# Patient Record
Sex: Female | Born: 1945 | Race: White | Hispanic: No | Marital: Married | Smoking: Never smoker
Health system: Southern US, Community
[De-identification: ages and names within clinical notes are randomized; demographics above are authoritative.]

## PROBLEM LIST (undated history)

## (undated) DIAGNOSIS — K219 Gastro-esophageal reflux disease without esophagitis: Secondary | ICD-10-CM

## (undated) DIAGNOSIS — I82409 Acute embolism and thrombosis of unspecified deep veins of unspecified lower extremity: Secondary | ICD-10-CM

## (undated) DIAGNOSIS — G4730 Sleep apnea, unspecified: Secondary | ICD-10-CM

## (undated) DIAGNOSIS — Z9889 Other specified postprocedural states: Secondary | ICD-10-CM

## (undated) DIAGNOSIS — E039 Hypothyroidism, unspecified: Secondary | ICD-10-CM

## (undated) DIAGNOSIS — R112 Nausea with vomiting, unspecified: Secondary | ICD-10-CM

## (undated) DIAGNOSIS — R011 Cardiac murmur, unspecified: Secondary | ICD-10-CM

## (undated) HISTORY — PX: TUBAL LIGATION: SHX77

## (undated) HISTORY — PX: THYROID SURGERY: SHX805

## (undated) HISTORY — PX: APPENDECTOMY: SHX54

## (undated) HISTORY — PX: OOPHORECTOMY: SHX86

## (undated) HISTORY — PX: BACK SURGERY: SHX140

---

## 2006-01-06 ENCOUNTER — Ambulatory Visit: Payer: Self-pay | Admitting: Family Medicine

## 2007-05-13 ENCOUNTER — Ambulatory Visit: Payer: Self-pay | Admitting: Pain Medicine

## 2007-05-19 ENCOUNTER — Ambulatory Visit: Payer: Self-pay | Admitting: Pain Medicine

## 2007-08-04 ENCOUNTER — Ambulatory Visit: Payer: Self-pay | Admitting: Pain Medicine

## 2007-08-24 ENCOUNTER — Ambulatory Visit: Payer: Self-pay | Admitting: Physician Assistant

## 2007-09-24 ENCOUNTER — Ambulatory Visit: Payer: Self-pay | Admitting: Physician Assistant

## 2007-10-01 ENCOUNTER — Ambulatory Visit: Payer: Self-pay | Admitting: Pain Medicine

## 2007-10-08 ENCOUNTER — Encounter: Payer: Self-pay | Admitting: Physician Assistant

## 2007-10-10 ENCOUNTER — Encounter: Payer: Self-pay | Admitting: Physician Assistant

## 2007-11-09 ENCOUNTER — Encounter: Payer: Self-pay | Admitting: Physician Assistant

## 2007-12-08 ENCOUNTER — Ambulatory Visit: Payer: Self-pay | Admitting: Physician Assistant

## 2007-12-10 ENCOUNTER — Encounter: Payer: Self-pay | Admitting: Physician Assistant

## 2007-12-22 ENCOUNTER — Ambulatory Visit: Payer: Self-pay | Admitting: Pain Medicine

## 2008-01-06 ENCOUNTER — Ambulatory Visit: Payer: Self-pay | Admitting: Physician Assistant

## 2008-01-19 ENCOUNTER — Encounter: Payer: Self-pay | Admitting: Physician Assistant

## 2008-01-25 ENCOUNTER — Ambulatory Visit: Payer: Self-pay | Admitting: Internal Medicine

## 2008-01-28 ENCOUNTER — Ambulatory Visit: Payer: Self-pay | Admitting: Pain Medicine

## 2008-02-07 ENCOUNTER — Encounter: Payer: Self-pay | Admitting: Physician Assistant

## 2008-02-15 ENCOUNTER — Ambulatory Visit: Payer: Self-pay | Admitting: Physician Assistant

## 2008-02-23 ENCOUNTER — Ambulatory Visit: Payer: Self-pay | Admitting: Pain Medicine

## 2008-03-01 ENCOUNTER — Encounter: Payer: Self-pay | Admitting: Pain Medicine

## 2008-03-09 ENCOUNTER — Encounter: Payer: Self-pay | Admitting: Pain Medicine

## 2008-03-10 ENCOUNTER — Ambulatory Visit: Payer: Self-pay | Admitting: Physician Assistant

## 2008-03-18 ENCOUNTER — Ambulatory Visit: Payer: Self-pay | Admitting: Family Medicine

## 2008-03-23 ENCOUNTER — Ambulatory Visit: Payer: Self-pay | Admitting: Physician Assistant

## 2008-04-08 ENCOUNTER — Encounter: Payer: Self-pay | Admitting: Pain Medicine

## 2008-04-20 ENCOUNTER — Ambulatory Visit: Payer: Self-pay | Admitting: Physician Assistant

## 2008-05-18 ENCOUNTER — Ambulatory Visit: Payer: Self-pay | Admitting: Physician Assistant

## 2008-06-19 ENCOUNTER — Ambulatory Visit: Payer: Self-pay | Admitting: Pain Medicine

## 2008-06-21 ENCOUNTER — Ambulatory Visit: Payer: Self-pay | Admitting: Physician Assistant

## 2008-07-21 ENCOUNTER — Ambulatory Visit: Payer: Self-pay | Admitting: Physician Assistant

## 2008-08-11 ENCOUNTER — Ambulatory Visit: Payer: Self-pay | Admitting: Pain Medicine

## 2008-08-18 ENCOUNTER — Ambulatory Visit: Payer: Self-pay | Admitting: Pain Medicine

## 2008-09-04 ENCOUNTER — Ambulatory Visit: Payer: Self-pay | Admitting: Family Medicine

## 2008-09-07 ENCOUNTER — Ambulatory Visit: Payer: Self-pay | Admitting: Pain Medicine

## 2008-10-18 ENCOUNTER — Ambulatory Visit: Payer: Self-pay | Admitting: Physician Assistant

## 2009-04-18 ENCOUNTER — Ambulatory Visit: Payer: Self-pay | Admitting: Physician Assistant

## 2009-06-22 ENCOUNTER — Emergency Department: Payer: Self-pay | Admitting: Unknown Physician Specialty

## 2009-06-28 ENCOUNTER — Ambulatory Visit: Payer: Self-pay | Admitting: Pain Medicine

## 2009-07-11 ENCOUNTER — Ambulatory Visit: Payer: Self-pay | Admitting: Pain Medicine

## 2009-07-25 ENCOUNTER — Ambulatory Visit: Payer: Self-pay | Admitting: Physician Assistant

## 2009-08-22 ENCOUNTER — Ambulatory Visit: Payer: Self-pay | Admitting: Pain Medicine

## 2009-09-05 ENCOUNTER — Ambulatory Visit: Payer: Self-pay | Admitting: Pain Medicine

## 2009-10-04 ENCOUNTER — Ambulatory Visit: Payer: Self-pay | Admitting: Physician Assistant

## 2009-10-17 ENCOUNTER — Ambulatory Visit: Payer: Self-pay | Admitting: Pain Medicine

## 2009-11-07 ENCOUNTER — Ambulatory Visit: Payer: Self-pay | Admitting: Physician Assistant

## 2009-11-10 ENCOUNTER — Ambulatory Visit: Payer: Self-pay | Admitting: Unknown Physician Specialty

## 2010-06-04 ENCOUNTER — Ambulatory Visit: Payer: Self-pay | Admitting: Family Medicine

## 2010-07-16 ENCOUNTER — Ambulatory Visit: Payer: Self-pay | Admitting: Pain Medicine

## 2011-03-02 ENCOUNTER — Ambulatory Visit: Payer: Self-pay | Admitting: Internal Medicine

## 2011-03-14 ENCOUNTER — Ambulatory Visit: Payer: Self-pay | Admitting: Pain Medicine

## 2011-03-27 ENCOUNTER — Ambulatory Visit: Payer: Self-pay | Admitting: Pain Medicine

## 2011-04-16 ENCOUNTER — Ambulatory Visit: Payer: Self-pay | Admitting: Pain Medicine

## 2011-05-09 ENCOUNTER — Ambulatory Visit: Payer: Self-pay | Admitting: Pain Medicine

## 2011-05-23 ENCOUNTER — Ambulatory Visit: Payer: Self-pay | Admitting: Pain Medicine

## 2011-06-26 ENCOUNTER — Ambulatory Visit: Payer: Self-pay | Admitting: Pain Medicine

## 2011-07-25 ENCOUNTER — Ambulatory Visit: Payer: Self-pay | Admitting: Pain Medicine

## 2011-07-26 ENCOUNTER — Encounter: Payer: Self-pay | Admitting: Pain Medicine

## 2011-11-11 ENCOUNTER — Ambulatory Visit: Payer: Self-pay | Admitting: Pain Medicine

## 2012-01-20 ENCOUNTER — Ambulatory Visit: Payer: Self-pay | Admitting: Neurological Surgery

## 2012-01-21 ENCOUNTER — Ambulatory Visit: Payer: Self-pay | Admitting: Orthopedic Surgery

## 2012-02-04 ENCOUNTER — Encounter (HOSPITAL_COMMUNITY): Payer: Self-pay | Admitting: Pharmacy Technician

## 2012-02-10 ENCOUNTER — Emergency Department: Payer: Self-pay

## 2012-02-10 ENCOUNTER — Inpatient Hospital Stay (HOSPITAL_COMMUNITY): Admission: RE | Admit: 2012-02-10 | Discharge: 2012-02-10 | Payer: Medicare Other | Source: Ambulatory Visit

## 2012-02-10 ENCOUNTER — Ambulatory Visit: Payer: Self-pay | Admitting: Family Medicine

## 2012-02-10 LAB — CBC
HCT: 38.2 % (ref 35.0–47.0)
HGB: 12.8 g/dL (ref 12.0–16.0)
MCH: 31.7 pg (ref 26.0–34.0)
MCHC: 33.4 g/dL (ref 32.0–36.0)

## 2012-02-10 LAB — PROTIME-INR
INR: 1
Prothrombin Time: 13.8 secs (ref 11.5–14.7)

## 2012-02-10 LAB — BASIC METABOLIC PANEL
Calcium, Total: 9.2 mg/dL (ref 8.5–10.1)
Co2: 24 mmol/L (ref 21–32)
EGFR (African American): >60
EGFR (Non-African Amer.): >60
Glucose: 147 mg/dL — ABNORMAL HIGH (ref 65–99)
Osmolality: 287 (ref 275–301)
Sodium: 142 mmol/L (ref 136–145)

## 2012-02-10 NOTE — Pre-Procedure Instructions (Signed)
19 Sheila Singleton  02/10/2012   Your procedure is scheduled on:  Wednesday February 19, 2012 at 1034 am  Report to [facility] at 0830 AM.  Call this number if you have problems the morning of surgery: [phone]   Remember:   Do not eat food:After Midnight.  May have clear liquids: up to 4 Hours before arrival until 0830 am.  Clear liquids include soda, tea, black coffee, apple or grape juice, broth.  Take these medicines the morning of surgery with A SIP OF WATER: Cymbalta, Levothryoxine,and  Prilosec.   Do not wear jewelry, make-up or nail polish.  Do not wear lotions, powders, or perfumes. You may wear deodorant.  Do not shave 48 hours prior to surgery.  Do not bring valuables to the hospital.  Contacts, dentures or bridgework may not be worn into surgery.  Leave suitcase in the car. After surgery it may be brought to your room.  For patients admitted to the hospital, checkout time is 11:00 AM the day of discharge.   Patients discharged the day of surgery will not be allowed to drive home.  Name and phone number of your driver:   Special Instructions: CHG Shower Use Special Wash: 1/2 bottle night before surgery and 1/2 bottle morning of surgery.   Please read over the following fact sheets that you were given: Pain Booklet, Coughing and Deep Breathing, Blood Transfusion Information and Surgical Site Infection Prevention

## 2012-02-11 ENCOUNTER — Inpatient Hospital Stay (HOSPITAL_COMMUNITY): Admission: RE | Admit: 2012-02-11 | Payer: Medicare Other | Source: Ambulatory Visit

## 2012-02-18 MED ORDER — VANCOMYCIN HCL 500 MG IV SOLR
500.0000 mg | Freq: Once | INTRAVENOUS | Status: DC
  Filled 2012-02-18: qty 500

## 2012-02-18 NOTE — Progress Notes (Signed)
Left message with answering service for Dr.Jones. Pt states surgery cancelled because she is being treated for a DVT.

## 2012-02-19 ENCOUNTER — Inpatient Hospital Stay (HOSPITAL_COMMUNITY): Admission: RE | Admit: 2012-02-19 | Payer: Medicare Other | Source: Ambulatory Visit | Admitting: Neurological Surgery

## 2012-02-19 ENCOUNTER — Encounter (HOSPITAL_COMMUNITY): Admission: RE | Payer: Self-pay | Source: Ambulatory Visit

## 2012-02-19 SURGERY — POSTERIOR LUMBAR FUSION 1 LEVEL
Anesthesia: General | Site: Back

## 2012-03-17 ENCOUNTER — Ambulatory Visit: Payer: Self-pay | Admitting: Family Medicine

## 2012-03-19 ENCOUNTER — Encounter (HOSPITAL_COMMUNITY): Payer: Self-pay | Admitting: Pharmacy Technician

## 2012-03-23 ENCOUNTER — Other Ambulatory Visit (HOSPITAL_COMMUNITY): Payer: Self-pay | Admitting: Neurological Surgery

## 2012-03-23 DIAGNOSIS — Z86718 Personal history of other venous thrombosis and embolism: Secondary | ICD-10-CM

## 2012-03-24 ENCOUNTER — Other Ambulatory Visit: Payer: Self-pay | Admitting: Radiology

## 2012-03-24 ENCOUNTER — Telehealth (HOSPITAL_COMMUNITY): Payer: Self-pay

## 2012-03-24 NOTE — Telephone Encounter (Signed)
Spoke w/ Sheila Singleton about her IVC procedure date and time.  She will be here

## 2012-03-26 ENCOUNTER — Encounter (HOSPITAL_COMMUNITY): Payer: Self-pay

## 2012-03-26 ENCOUNTER — Encounter (HOSPITAL_COMMUNITY)
Admission: RE | Admit: 2012-03-26 | Discharge: 2012-03-26 | Disposition: A | Discharge status: Home / Self Care | Payer: Medicare Other | Source: Ambulatory Visit | Attending: Neurological Surgery | Admitting: Neurological Surgery

## 2012-03-26 HISTORY — DX: Nausea with vomiting, unspecified: R11.2

## 2012-03-26 HISTORY — DX: Other specified postprocedural states: Z98.890

## 2012-03-26 HISTORY — DX: Hypothyroidism, unspecified: E03.9

## 2012-03-26 HISTORY — DX: Gastro-esophageal reflux disease without esophagitis: K21.9

## 2012-03-26 LAB — BASIC METABOLIC PANEL
CO2: 24 mEq/L (ref 19–32)
Chloride: 103 mEq/L (ref 96–112)
Creatinine, Ser: 0.81 mg/dL (ref 0.50–1.10)

## 2012-03-26 LAB — TYPE AND SCREEN: Antibody Screen: NEGATIVE

## 2012-03-26 LAB — SURGICAL PCR SCREEN
MRSA, PCR: POSITIVE — AB
Staphylococcus aureus: POSITIVE — AB

## 2012-03-26 LAB — CBC
HCT: 38.2 % (ref 36.0–46.0)
MCV: 92.5 fL (ref 78.0–100.0)
RBC: 4.13 MIL/uL (ref 3.87–5.11)
RDW: 14.4 % (ref 11.5–15.5)
WBC: 8.8 10*3/uL (ref 4.0–10.5)

## 2012-03-26 LAB — DIFFERENTIAL
Basophils Relative: 0 % (ref 0–1)
Eosinophils Relative: 3 % (ref 0–5)
Lymphs Abs: 1.6 10*3/uL (ref 0.7–4.0)
Monocytes Relative: 5 % (ref 3–12)
Neutro Abs: 6.5 10*3/uL (ref 1.7–7.7)

## 2012-03-26 LAB — APTT: aPTT: 31 seconds (ref 24–37)

## 2012-03-26 NOTE — Pre-Procedure Instructions (Signed)
20 GILDA ABBOUD  03/26/2012   Your procedure is scheduled on:  Thursday, April 25  Report to [facility] at 5:30AM.  Call this number if you have problems the morning of surgery: [phone]   Remember:   Do not eat food:After Midnight.  May have clear liquids: up to 4 Hours before arrival.  (1:30AM)  Clear liquids include soda, tea, black coffee, apple or grape juice, broth.  Take these medicines the morning of surgery with A SIP OF WATER: TYLENOL, SYNTHROID,PRILOSEC                STOP COUMADIN AS DIRECTED   Do not wear jewelry, make-up or nail polish.  Do not wear lotions, powders, or perfumes. You may wear deodorant.  Do not shave 48 hours prior to surgery.  Do not bring valuables to the hospital.  Contacts, dentures or bridgework may not be worn into surgery.  Leave suitcase in the car. After surgery it may be brought to your room.  For patients admitted to the hospital, checkout time is 11:00 AM the day of discharge.   Patients discharged the day of surgery will not be allowed to drive home.  Name and phone number of your driver: NA  Special Instructions: CHG Shower Use Special Wash: 1/2 bottle night before surgery and 1/2 bottle morning of surgery.   Please read over the following fact sheets that you were given: Pain Booklet, Coughing and Deep Breathing, Blood Transfusion Information, MRSA Information and Surgical Site Infection Prevention

## 2012-03-27 ENCOUNTER — Ambulatory Visit (HOSPITAL_COMMUNITY)
Admission: RE | Admit: 2012-03-27 | Discharge: 2012-03-27 | Disposition: A | Discharge status: Home / Self Care | Payer: Medicare Other | Source: Ambulatory Visit | Attending: Neurological Surgery | Admitting: Neurological Surgery

## 2012-03-27 ENCOUNTER — Encounter (HOSPITAL_COMMUNITY): Payer: Self-pay

## 2012-03-27 DIAGNOSIS — Z01812 Encounter for preprocedural laboratory examination: Secondary | ICD-10-CM | POA: Insufficient documentation

## 2012-03-27 DIAGNOSIS — Z86718 Personal history of other venous thrombosis and embolism: Secondary | ICD-10-CM

## 2012-03-27 DIAGNOSIS — F430 Acute stress reaction: Secondary | ICD-10-CM | POA: Insufficient documentation

## 2012-03-27 DIAGNOSIS — E119 Type 2 diabetes mellitus without complications: Secondary | ICD-10-CM | POA: Insufficient documentation

## 2012-03-27 DIAGNOSIS — Z7901 Long term (current) use of anticoagulants: Secondary | ICD-10-CM | POA: Insufficient documentation

## 2012-03-27 DIAGNOSIS — E039 Hypothyroidism, unspecified: Secondary | ICD-10-CM | POA: Insufficient documentation

## 2012-03-27 DIAGNOSIS — I82409 Acute embolism and thrombosis of unspecified deep veins of unspecified lower extremity: Secondary | ICD-10-CM | POA: Insufficient documentation

## 2012-03-27 HISTORY — DX: Acute embolism and thrombosis of unspecified deep veins of unspecified lower extremity: I82.409

## 2012-03-27 LAB — GLUCOSE, CAPILLARY: Glucose-Capillary: 169 mg/dL — ABNORMAL HIGH (ref 70–99)

## 2012-03-27 MED ORDER — FENTANYL CITRATE 0.05 MG/ML IJ SOLN
INTRAMUSCULAR | Status: AC | PRN
  Administered 2012-03-27: 50 ug via INTRAVENOUS

## 2012-03-27 MED ORDER — IOHEXOL 300 MG/ML  SOLN
100.0000 mL | Freq: Once | INTRAMUSCULAR | Status: AC | PRN
  Administered 2012-03-27: 45 mL via INTRAVENOUS

## 2012-03-27 MED ORDER — SODIUM CHLORIDE 0.9 % IV SOLN
Freq: Once | INTRAVENOUS | Status: AC
  Administered 2012-03-27: 09:00:00 via INTRAVENOUS

## 2012-03-27 MED ORDER — MIDAZOLAM HCL 5 MG/5ML IJ SOLN
INTRAMUSCULAR | Status: AC | PRN
  Administered 2012-03-27: 1 mg via INTRAVENOUS

## 2012-03-27 MED ORDER — MIDAZOLAM HCL 2 MG/2ML IJ SOLN
INTRAMUSCULAR | Status: AC
  Filled 2012-03-27: qty 4

## 2012-03-27 MED ORDER — FENTANYL CITRATE 0.05 MG/ML IJ SOLN
INTRAMUSCULAR | Status: AC
  Filled 2012-03-27: qty 4

## 2012-03-27 NOTE — H&P (Signed)
Sheila Singleton is an 66 y.o. female.   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 PONV (postoperative nausea and vomiting)     DIFFICULTY AWAKENING  . Diabetes mellitus   . Hypothyroidism   . GERD (gastroesophageal reflux disease)   . DVT (deep venous thrombosis)     post operative x 2    Past Surgical History  Procedure Date  . Appendectomy   . Thyroid surgery     LEFT  . Oophorectomy   . Tubal ligation     No family history on file. Social History:  does not have a smoking history on file. She does not have any smokeless tobacco history on file. She reports that she does not drink alcohol or use illicit drugs.  Allergies:  Allergies  Allergen Reactions  . Tramadol Shortness Of Breath  . Metformin And Related Diarrhea and Nausea Only  . Penicillins Swelling    Medications Prior to Admission  Medication Sig Dispense Refill  . acetaminophen (TYLENOL) 650 MG suppository Place 1,300 mg rectally 2 (two) times daily.      . beta carotene w/minerals (OCUVITE) tablet Take 1 tablet by mouth daily.      . cholecalciferol (VITAMIN D) 1000 UNITS tablet Take 1,000 Units by mouth daily.      . DULoxetine (CYMBALTA) 60 MG capsule Take 60 mg by mouth daily.      Marland Kitchen glipiZIDE (GLUCOTROL) 10 MG tablet Take 20 mg by mouth daily.      Marland Kitchen levothyroxine (SYNTHROID, LEVOTHROID) 175 MCG tablet Take 175 mcg by mouth daily.      . Multiple Vitamins-Minerals (MULTIVITAMIN GUMMIES ADULT) CHEW Chew 2 tablets by mouth daily.      Marland Kitchen omeprazole (PRILOSEC) 20 MG capsule Take 20 mg by mouth daily.      . pioglitazone (ACTOS) 30 MG  tablet Take 30 mg by mouth daily.      Marland Kitchen warfarin (COUMADIN) 3 MG tablet Take 3 mg by mouth See admin instructions. Mon, tues, wednes, thurs, fri      . warfarin (COUMADIN) 4 MG tablet Take 4 mg by mouth See admin instructions. On Saturday and sunday       Medications Prior to Admission  Medication Dose Route Frequency Provider Last Rate Last Dose  . 0.9 %  sodium chloride infusion   Intravenous Once Robet Leu, PA        Results for orders placed during the hospital encounter of 03/26/12 (from the past 48 hour(s))  SURGICAL PCR SCREEN     Status: Abnormal   Collection Time   03/26/12  3:17 PM      Component Value Range Comment   MRSA, PCR POSITIVE (*) NEGATIVE     Staphylococcus aureus POSITIVE (*) NEGATIVE    APTT     Status: Normal   Collection Time   03/26/12  3:30 PM      Component Value Range Comment   aPTT 31  24 - 37 (seconds)   BASIC METABOLIC PANEL     Status: Abnormal   Collection Time   03/26/12  3:30 PM  Component Value Range Comment   Sodium 139  135 - 145 (mEq/L)    Potassium 3.9  3.5 - 5.1 (mEq/L)    Chloride 103  96 - 112 (mEq/L)    CO2 24  19 - 32 (mEq/L)    Glucose, Bld 104 (*) 70 - 99 (mg/dL)    BUN 15  6 - 23 (mg/dL)    Creatinine, Ser 1.61  0.50 - 1.10 (mg/dL)    Calcium 9.4  8.4 - 10.5 (mg/dL)    GFR calc non Af Amer 73 (*) >90 (mL/min)    GFR calc Af Amer 85 (*) >90 (mL/min)   CBC     Status: Normal   Collection Time   03/26/12  3:30 PM      Component Value Range Comment   WBC 8.8  4.0 - 10.5 (K/uL) WHITE COUNT CONFIRMED ON SMEAR   RBC 4.13  3.87 - 5.11 (MIL/uL)    Hemoglobin 13.0  12.0 - 15.0 (g/dL)    HCT 09.6  04.5 - 40.9 (%)    MCV 92.5  78.0 - 100.0 (fL)    MCH 31.5  26.0 - 34.0 (pg)    MCHC 34.0  30.0 - 36.0 (g/dL)    RDW 81.1  91.4 - 78.2 (%)    Platelets 328  150 - 400 (K/uL)   DIFFERENTIAL     Status: Normal   Collection Time   03/26/12  3:30 PM      Component Value Range Comment   Neutrophils Relative 74  43 - 77 (%)     Lymphocytes Relative 18  12 - 46 (%)    Monocytes Relative 5  3 - 12 (%)    Eosinophils Relative 3  0 - 5 (%)    Basophils Relative 0  0 - 1 (%)    Neutro Abs 6.5  1.7 - 7.7 (K/uL)    Lymphs Abs 1.6  0.7 - 4.0 (K/uL)    Monocytes Absolute 0.4  0.1 - 1.0 (K/uL)    Eosinophils Absolute 0.3  0.0 - 0.7 (K/uL)    Basophils Absolute 0.0  0.0 - 0.1 (K/uL)    Smear Review MORPHOLOGY UNREMARKABLE     PROTIME-INR     Status: Normal   Collection Time   03/26/12  3:30 PM      Component Value Range Comment   Prothrombin Time 14.9  11.6 - 15.2 (seconds)    INR 1.15  0.00 - 1.49    TYPE AND SCREEN     Status: Normal   Collection Time   03/26/12  4:00 PM      Component Value Range Comment   ABO/RH(D) O POS      Antibody Screen NEG      Sample Expiration 04/09/2012     ABO/RH     Status: Normal   Collection Time   03/26/12  4:00 PM      Component Value Range Comment   ABO/RH(D) O POS      Dg Chest 2 View  03/26/2012  *RADIOLOGY REPORT*  Clinical Data: Preop for spondylolisthesis lumbar spine  CHEST - 2 VIEW  Comparison: None.  Findings: Cardiomediastinal silhouette is unremarkable.  No acute infiltrate or pleural effusion.  No pulmonary edema.  Mild degenerative changes mid thoracic spine.  Linear atelectasis or scarring noted in the lingula and right base.  IMPRESSION: No acute infiltrate or pulmonary edema.  Linear atelectasis or scarring noted right base and lingula.  Original Report Authenticated By: Lang Snow  POP, M.D.    Review of Systems  Constitutional: Negative for fever.  Respiratory: Negative for cough.   Cardiovascular: Negative for chest pain.  Gastrointestinal: Negative for nausea and vomiting.  Neurological: Negative for headaches.    There were no vitals taken for this visit. Physical Exam  Constitutional: She is oriented to person, place, and time. She appears well-developed and well-nourished.  Cardiovascular: Normal rate, regular rhythm and normal heart sounds.   No murmur  heard. Respiratory: Effort normal and breath sounds normal. She has no wheezes.  GI: Soft. Bowel sounds are normal. There is no tenderness.  Musculoskeletal: Normal range of motion.  Neurological: She is alert and oriented to person, place, and time.  Skin: Skin is warm and dry.  Psychiatric: She has a normal mood and affect. Her behavior is normal. Judgment and thought content normal.     Assessment/Plan Pt has hx of recurrent DVT; scheduled for lumbar surgery 4/25 Off coumadin now for 1 week Scheduled now for retrievable IVC filter Pt aware of procedure benefits and risks and agreeable to proceed. Consent signed.  Naiyana Barbian A 03/27/2012, 8:17 AM

## 2012-03-27 NOTE — Discharge Instructions (Signed)
Inferior Vena Cava Filter Insertion °Insertion of an inferior vena cava (IVC) filter is usually a safe procedure. It is a procedure using a filter designed to help prevent blood clots in the legs or pelvis from traveling to the lungs. A large blood clot in the lungs can cause death. The risks involved are usually small and easily managed. This device is only used when blood thinners (anticoagulants) cannot be used. Some of these reasons may be: °· Severe platelet problems or shortage.  °· Recent or current major bleeding which cannot be treated.  °· Bleeding associated with anticoagulants.  °· Recurrence of blood clot while on anticoagulants.  °· The need for surgery in the near future.  °· Bleeding in the head.  °The filter is a small, metal device about an inch long. It is shaped like the spokes of an umbrella. The filter is placed in the inferior vena cava. The inferior vena cava is the large vein which returns blood from the lower body to the heart.  °Blood clots are sometimes treated with blood thinning drugs called anti-coagulants. Filters are used when blood thinners may not be enough. Your caregivers will discuss these issues with you. Together you can determine the best course of treatment to take. °EXPECTATIONS OF A FILTER °· An IVC filter will reduce the risk, not eliminate it and cannot prevent small PE's.  °· It does not stop deep vein clots from growing, recurring, or developing the postphlebitic syndrome.  °RISKS AND COMPLICATIONS °· Vena cava filter insertion is a very safe procedure. Occasionally a small bruise forms around the needle insertion. A larger accumulation of blood called a hematoma may form. This is usually of no concern.  °· Continued bleeding or infections are uncommon.  °· Rarely damage is done to the vein by the catheter. This may require surgery or repair. There is a possibility that the filter can cause blockage of the vena cava. This can cause some swelling of the legs. There is a  possibility that the filter will eventually fail and not work properly. Despite some problems, the procedure is usually safe and carried out without them  °PROCEDURE  °· The procedure usually takes about one half to one hour but this can vary.  °· A specialist in reading x-rays (radiologist) usually performs this procedure.  °· It is usually performed in a special room in the x-ray department. It is often done in a hospital or same-day surgical center. You will be asked to put on a hospital gown.  °· Let your caregivers know of all allergies. This is very important if you have reacted to a dye given through the vein (intravenous) used for x-rays.  °· Do not eat or drink for four hours before the procedure or as instructed by your caregiver.  °· Sedatives are sometimes given to relieve anxiety.  °· The procedure is often done through the femoral vein (big vein in the groin). The skin around this area is usually shaved.  °· You will lie on the X-ray table, generally flat on your back. You need to have a needle put into a vein in your arm, so that the radiologist can give you medications (sedatives or painkillers).  °· An oxygen monitoring device may be attached to your finger. Oxygen may be given.  °· Everything is kept as sterile as possible during the procedure. The skin near the point of needle insertion will be cleaned with antiseptic and the area draped with sterile towels.  °·   The skin and deeper tissues over the vein will be made numb with a local anesthetic. This is a medication like Novocaine. You are awake during the procedure and can let your caregivers know if you have discomfort.  °· A needle is then put into the vein. A guide wire is placed through the needle and into the vein. This is used to help insert a catheter into your vein. The radiologist uses the X-ray equipment to make sure that the catheter and the wire are in the correct position. The wire is then withdrawn. The filter can then be released  from the catheter and left in place in the vena cava.  °· The catheter is then removed. Pressure will be kept on the needle insertion point for several minutes or until it is unlikely to bleed.  °SEEK IMMEDIATE MEDICAL CARE IF:  °· You develop swelling and discoloration or pain in the legs.  °· Your legs become pale and cold or blue.  °· You develop shortness of breath, feel faint or pass out.  °· You develop chest pain, cough, difficulty breathing or cough up blood.  °· You develop a rash or feel you are having problems which may be a side effect of medications given.  °· You develop weakness, difficulty moving your arms or legs or balance problems.  °· You develop problems with speech or vision.  °Document Released: 01/15/2006 Document Revised: 11/14/2011 Document Reviewed: 01/04/2008 °[employer]® Patient Information ©2012 [employer]. °

## 2012-03-27 NOTE — Procedures (Signed)
Successful placement of an infrarenal IVC filter via the right internal jugular vein..  No immediate complications.   

## 2012-03-27 NOTE — Consult Note (Signed)
Anesthesia Chart Review:  Patient is a 66 year old female scheduled for PLIF L4-5 with pedicle screws on 04/02/12.  History includes post-op N/V, DM2, hypothyroidism, GERD, 2 post-operative DVT.  By notes, she had a doppler last week that showed no existing DVT.  With her upcoming surgery and history, a retrievable IVC filter was recommended by Dr. Yetta Barre and placed today in IR, 03/27/12.     Labs acceptable.  CXR on 03/26/12 showed: No acute infiltrate or pulmonary edema. Linear atelectasis or  scarring noted right base and lingula.  EKG from 03/26/12 showed NSR with sinus arrhythmia.  Plan to proceed if no acute change in her status.

## 2012-04-02 ENCOUNTER — Encounter (HOSPITAL_COMMUNITY): Payer: Self-pay | Admitting: Vascular Surgery

## 2012-04-02 ENCOUNTER — Encounter (HOSPITAL_COMMUNITY): Payer: Self-pay | Admitting: General Practice

## 2012-04-02 ENCOUNTER — Inpatient Hospital Stay (HOSPITAL_COMMUNITY): Payer: Medicare Other | Admitting: Vascular Surgery

## 2012-04-02 ENCOUNTER — Inpatient Hospital Stay (HOSPITAL_COMMUNITY)
Admission: RE | Admit: 2012-04-02 | Discharge: 2012-04-05 | DRG: 460 | Disposition: A | Discharge status: Home / Self Care | Payer: Medicare Other | Source: Ambulatory Visit | Attending: Neurological Surgery | Admitting: Neurological Surgery

## 2012-04-02 ENCOUNTER — Encounter (HOSPITAL_COMMUNITY): Payer: Self-pay | Admitting: *Deleted

## 2012-04-02 ENCOUNTER — Inpatient Hospital Stay (HOSPITAL_COMMUNITY): Payer: Medicare Other

## 2012-04-02 ENCOUNTER — Encounter (HOSPITAL_COMMUNITY)
Admission: RE | Disposition: A | Discharge status: Home / Self Care | Payer: Self-pay | Source: Ambulatory Visit | Attending: Neurological Surgery

## 2012-04-02 DIAGNOSIS — Z6841 Body Mass Index (BMI) 40.0 and over, adult: Secondary | ICD-10-CM

## 2012-04-02 DIAGNOSIS — Z79899 Other long term (current) drug therapy: Secondary | ICD-10-CM

## 2012-04-02 DIAGNOSIS — M47816 Spondylosis without myelopathy or radiculopathy, lumbar region: Secondary | ICD-10-CM

## 2012-04-02 DIAGNOSIS — Z7901 Long term (current) use of anticoagulants: Secondary | ICD-10-CM

## 2012-04-02 DIAGNOSIS — Q762 Congenital spondylolisthesis: Principal | ICD-10-CM

## 2012-04-02 DIAGNOSIS — M48062 Spinal stenosis, lumbar region with neurogenic claudication: Secondary | ICD-10-CM | POA: Diagnosis present

## 2012-04-02 DIAGNOSIS — E119 Type 2 diabetes mellitus without complications: Secondary | ICD-10-CM | POA: Diagnosis present

## 2012-04-02 DIAGNOSIS — Z01818 Encounter for other preprocedural examination: Secondary | ICD-10-CM

## 2012-04-02 DIAGNOSIS — K219 Gastro-esophageal reflux disease without esophagitis: Secondary | ICD-10-CM | POA: Diagnosis present

## 2012-04-02 DIAGNOSIS — E039 Hypothyroidism, unspecified: Secondary | ICD-10-CM | POA: Diagnosis present

## 2012-04-02 DIAGNOSIS — Z86718 Personal history of other venous thrombosis and embolism: Secondary | ICD-10-CM

## 2012-04-02 HISTORY — DX: Cardiac murmur, unspecified: R01.1

## 2012-04-02 HISTORY — DX: Sleep apnea, unspecified: G47.30

## 2012-04-02 HISTORY — PX: LUMBAR FUSION: SHX111

## 2012-04-02 LAB — GLUCOSE, CAPILLARY
Glucose-Capillary: 166 mg/dL — ABNORMAL HIGH (ref 70–99)
Glucose-Capillary: 232 mg/dL — ABNORMAL HIGH (ref 70–99)

## 2012-04-02 SURGERY — POSTERIOR LUMBAR FUSION 1 LEVEL
Anesthesia: General | Site: Back

## 2012-04-02 MED ORDER — MORPHINE SULFATE 2 MG/ML IJ SOLN: 0.0500 mg/kg | INTRAMUSCULAR | Status: DC | PRN

## 2012-04-02 MED ORDER — HETASTARCH-ELECTROLYTES 6 % IV SOLN
INTRAVENOUS | Status: DC | PRN
  Administered 2012-04-02: 09:00:00 via INTRAVENOUS

## 2012-04-02 MED ORDER — DULOXETINE HCL 60 MG PO CPEP
60.0000 mg | ORAL_CAPSULE | Freq: Every day | ORAL | Status: DC
  Administered 2012-04-02 – 2012-04-05 (×4): 60 mg via ORAL
  Filled 2012-04-02 (×4): qty 1

## 2012-04-02 MED ORDER — SCOPOLAMINE 1 MG/3DAYS TD PT72
MEDICATED_PATCH | TRANSDERMAL | Status: DC | PRN
  Administered 2012-04-02: 1 via TRANSDERMAL

## 2012-04-02 MED ORDER — ENOXAPARIN SODIUM 40 MG/0.4ML ~~LOC~~ SOLN
40.0000 mg | SUBCUTANEOUS | Status: DC
  Administered 2012-04-03 – 2012-04-04 (×2): 40 mg via SUBCUTANEOUS
  Filled 2012-04-02 (×3): qty 0.4

## 2012-04-02 MED ORDER — CELECOXIB 200 MG PO CAPS
200.0000 mg | ORAL_CAPSULE | Freq: Two times a day (BID) | ORAL | Status: DC
  Administered 2012-04-02 – 2012-04-05 (×7): 200 mg via ORAL
  Filled 2012-04-02 (×8): qty 1

## 2012-04-02 MED ORDER — PHENOL 1.4 % MT LIQD
1.0000 | OROMUCOSAL | Status: DC | PRN
  Filled 2012-04-02: qty 177

## 2012-04-02 MED ORDER — LIDOCAINE HCL (CARDIAC) 20 MG/ML IV SOLN
INTRAVENOUS | Status: DC | PRN
  Administered 2012-04-02: 50 mg via INTRAVENOUS

## 2012-04-02 MED ORDER — ONDANSETRON HCL 4 MG/2ML IJ SOLN: 4.0000 mg | Freq: Once | INTRAMUSCULAR | Status: DC | PRN

## 2012-04-02 MED ORDER — VECURONIUM BROMIDE 10 MG IV SOLR
INTRAVENOUS | Status: DC | PRN
  Administered 2012-04-02 (×2): 1 mg via INTRAVENOUS

## 2012-04-02 MED ORDER — SODIUM CHLORIDE 0.9 % IJ SOLN
3.0000 mL | Freq: Two times a day (BID) | INTRAMUSCULAR | Status: DC
  Administered 2012-04-03 – 2012-04-04 (×3): 3 mL via INTRAVENOUS

## 2012-04-02 MED ORDER — LEVOTHYROXINE SODIUM 175 MCG PO TABS
175.0000 ug | ORAL_TABLET | Freq: Every day | ORAL | Status: DC
  Administered 2012-04-03 – 2012-04-05 (×3): 175 ug via ORAL
  Filled 2012-04-02 (×3): qty 1

## 2012-04-02 MED ORDER — GLYCOPYRROLATE 0.2 MG/ML IJ SOLN
INTRAMUSCULAR | Status: DC | PRN
  Administered 2012-04-02: 1 mg via INTRAVENOUS

## 2012-04-02 MED ORDER — MENTHOL 3 MG MT LOZG
1.0000 | LOZENGE | OROMUCOSAL | Status: DC | PRN
  Filled 2012-04-02: qty 9

## 2012-04-02 MED ORDER — PIOGLITAZONE HCL 30 MG PO TABS
30.0000 mg | ORAL_TABLET | Freq: Every day | ORAL | Status: DC
  Administered 2012-04-02 – 2012-04-03 (×2): 30 mg via ORAL
  Filled 2012-04-02 (×4): qty 1

## 2012-04-02 MED ORDER — HYDROMORPHONE HCL PF 1 MG/ML IJ SOLN: 0.5000 mg | INTRAMUSCULAR | Status: DC | PRN

## 2012-04-02 MED ORDER — OXYCODONE-ACETAMINOPHEN 5-325 MG PO TABS
1.0000 | ORAL_TABLET | ORAL | Status: DC | PRN
  Administered 2012-04-02 – 2012-04-05 (×11): 2 via ORAL
  Filled 2012-04-02 (×11): qty 2

## 2012-04-02 MED ORDER — SODIUM CHLORIDE 0.9 % IV SOLN: 250.0000 mL | INTRAVENOUS | Status: DC

## 2012-04-02 MED ORDER — POTASSIUM CHLORIDE IN NACL 20-0.9 MEQ/L-% IV SOLN
INTRAVENOUS | Status: DC
  Administered 2012-04-02 – 2012-04-03 (×2): via INTRAVENOUS
  Filled 2012-04-02 (×7): qty 1000

## 2012-04-02 MED ORDER — THROMBIN 20000 UNITS EX KIT
PACK | CUTANEOUS | Status: DC | PRN
  Administered 2012-04-02: 08:00:00 via TOPICAL

## 2012-04-02 MED ORDER — VANCOMYCIN HCL IN DEXTROSE 1-5 GM/200ML-% IV SOLN
1000.0000 mg | Freq: Once | INTRAVENOUS | Status: AC
  Administered 2012-04-02: 1000 mg via INTRAVENOUS
  Filled 2012-04-02: qty 200

## 2012-04-02 MED ORDER — MIDAZOLAM HCL 5 MG/5ML IJ SOLN
INTRAMUSCULAR | Status: DC | PRN
  Administered 2012-04-02: 4 mg via INTRAVENOUS

## 2012-04-02 MED ORDER — HYDROMORPHONE HCL PF 1 MG/ML IJ SOLN
0.2500 mg | INTRAMUSCULAR | Status: DC | PRN
  Administered 2012-04-02 (×2): 0.5 mg via INTRAVENOUS

## 2012-04-02 MED ORDER — ACETAMINOPHEN 325 MG PO TABS: 650.0000 mg | ORAL_TABLET | ORAL | Status: DC | PRN

## 2012-04-02 MED ORDER — 0.9 % SODIUM CHLORIDE (POUR BTL) OPTIME
TOPICAL | Status: DC | PRN
  Administered 2012-04-02: 1000 mL

## 2012-04-02 MED ORDER — SODIUM CHLORIDE 0.9 % IV SOLN
INTRAVENOUS | Status: AC
  Filled 2012-04-02: qty 500

## 2012-04-02 MED ORDER — LACTATED RINGERS IV SOLN
INTRAVENOUS | Status: DC | PRN
  Administered 2012-04-02 (×3): via INTRAVENOUS

## 2012-04-02 MED ORDER — SODIUM CHLORIDE 0.9 % IJ SOLN: 3.0000 mL | INTRAMUSCULAR | Status: DC | PRN

## 2012-04-02 MED ORDER — FENTANYL CITRATE 0.05 MG/ML IJ SOLN
INTRAMUSCULAR | Status: DC | PRN
  Administered 2012-04-02: 50 ug via INTRAVENOUS
  Administered 2012-04-02 (×2): 100 ug via INTRAVENOUS

## 2012-04-02 MED ORDER — DEXAMETHASONE SODIUM PHOSPHATE 10 MG/ML IJ SOLN: 10.0000 mg | Freq: Once | INTRAMUSCULAR | Status: DC

## 2012-04-02 MED ORDER — MORPHINE SULFATE 4 MG/ML IJ SOLN: 0.0500 mg/kg | INTRAMUSCULAR | Status: DC | PRN

## 2012-04-02 MED ORDER — DEXAMETHASONE SODIUM PHOSPHATE 4 MG/ML IJ SOLN: 4.0000 mg | Freq: Four times a day (QID) | INTRAMUSCULAR | Status: AC

## 2012-04-02 MED ORDER — BUPIVACAINE HCL (PF) 0.25 % IJ SOLN
INTRAMUSCULAR | Status: DC | PRN
  Administered 2012-04-02: 8 mL

## 2012-04-02 MED ORDER — VANCOMYCIN HCL 1000 MG IV SOLR
1000.0000 mg | INTRAVENOUS | Status: DC | PRN
  Administered 2012-04-02: 1000 mg via INTRAVENOUS

## 2012-04-02 MED ORDER — ACETAMINOPHEN 650 MG RE SUPP: 650.0000 mg | RECTAL | Status: DC | PRN

## 2012-04-02 MED ORDER — PROPOFOL 10 MG/ML IV EMUL
INTRAVENOUS | Status: DC | PRN
  Administered 2012-04-02: 200 mg via INTRAVENOUS

## 2012-04-02 MED ORDER — EPHEDRINE SULFATE 50 MG/ML IJ SOLN
INTRAMUSCULAR | Status: DC | PRN
  Administered 2012-04-02: 5 mg via INTRAVENOUS
  Administered 2012-04-02: 10 mg via INTRAVENOUS
  Administered 2012-04-02 (×2): 5 mg via INTRAVENOUS

## 2012-04-02 MED ORDER — VITAMIN D3 25 MCG (1000 UNIT) PO TABS
1000.0000 [IU] | ORAL_TABLET | Freq: Every day | ORAL | Status: DC
  Administered 2012-04-02 – 2012-04-05 (×4): 1000 [IU] via ORAL
  Filled 2012-04-02 (×4): qty 1

## 2012-04-02 MED ORDER — DEXAMETHASONE 4 MG PO TABS
4.0000 mg | ORAL_TABLET | Freq: Four times a day (QID) | ORAL | Status: AC
  Administered 2012-04-02 – 2012-04-03 (×3): 4 mg via ORAL
  Filled 2012-04-02 (×4): qty 1

## 2012-04-02 MED ORDER — THROMBIN 5000 UNITS EX SOLR
CUTANEOUS | Status: DC | PRN
  Administered 2012-04-02: 5000 [IU] via TOPICAL

## 2012-04-02 MED ORDER — ONDANSETRON HCL 4 MG/2ML IJ SOLN
INTRAMUSCULAR | Status: DC | PRN
  Administered 2012-04-02 (×2): 4 mg via INTRAVENOUS

## 2012-04-02 MED ORDER — GLIPIZIDE 10 MG PO TABS
20.0000 mg | ORAL_TABLET | Freq: Every day | ORAL | Status: DC
Start: 2012-04-02 — End: 2012-04-05
  Administered 2012-04-02 – 2012-04-04 (×3): 20 mg via ORAL
  Filled 2012-04-02 (×6): qty 2

## 2012-04-02 MED ORDER — SODIUM CHLORIDE 0.9 % IR SOLN
Status: DC | PRN
  Administered 2012-04-02: 08:00:00

## 2012-04-02 MED ORDER — METHOCARBAMOL 500 MG PO TABS
500.0000 mg | ORAL_TABLET | Freq: Four times a day (QID) | ORAL | Status: DC | PRN
Start: 2012-04-02 — End: 2012-04-05
  Administered 2012-04-03 – 2012-04-04 (×3): 500 mg via ORAL
  Filled 2012-04-02 (×3): qty 1

## 2012-04-02 MED ORDER — ONDANSETRON HCL 4 MG/2ML IJ SOLN: 4.0000 mg | INTRAMUSCULAR | Status: DC | PRN

## 2012-04-02 MED ORDER — HEMOSTATIC AGENTS (NO CHARGE) OPTIME
TOPICAL | Status: DC | PRN
  Administered 2012-04-02: 1 via TOPICAL

## 2012-04-02 MED ORDER — PANTOPRAZOLE SODIUM 40 MG PO TBEC
40.0000 mg | DELAYED_RELEASE_TABLET | Freq: Every day | ORAL | Status: DC
  Administered 2012-04-03 – 2012-04-05 (×3): 40 mg via ORAL
  Filled 2012-04-02 (×2): qty 1

## 2012-04-02 MED ORDER — CEFAZOLIN SODIUM 1-5 GM-% IV SOLN: 1.0000 g | Freq: Three times a day (TID) | INTRAVENOUS | Status: DC

## 2012-04-02 MED ORDER — SUCCINYLCHOLINE CHLORIDE 20 MG/ML IJ SOLN
INTRAMUSCULAR | Status: DC | PRN
Start: 2012-04-02 — End: 2012-04-02
  Administered 2012-04-02: 100 mg via INTRAVENOUS

## 2012-04-02 MED ORDER — HYDROMORPHONE HCL PF 1 MG/ML IJ SOLN
INTRAMUSCULAR | Status: AC
  Filled 2012-04-02: qty 1

## 2012-04-02 MED ORDER — NEOSTIGMINE METHYLSULFATE 1 MG/ML IJ SOLN
INTRAMUSCULAR | Status: DC | PRN
  Administered 2012-04-02: 5 mg via INTRAVENOUS

## 2012-04-02 MED ORDER — BACITRACIN 50000 UNITS IM SOLR
INTRAMUSCULAR | Status: AC
  Filled 2012-04-02: qty 1

## 2012-04-02 MED ORDER — METHOCARBAMOL 100 MG/ML IJ SOLN
500.0000 mg | Freq: Four times a day (QID) | INTRAMUSCULAR | Status: DC | PRN
  Filled 2012-04-02: qty 5

## 2012-04-02 MED ORDER — DEXAMETHASONE SODIUM PHOSPHATE 10 MG/ML IJ SOLN
INTRAMUSCULAR | Status: AC
  Administered 2012-04-02: 10 mg via INTRAVENOUS
  Filled 2012-04-02: qty 1

## 2012-04-02 MED ORDER — ROCURONIUM BROMIDE 100 MG/10ML IV SOLN
INTRAVENOUS | Status: DC | PRN
  Administered 2012-04-02: 50 mg via INTRAVENOUS

## 2012-04-02 SURGICAL SUPPLY — 56 items
BAG DECANTER FOR FLEXI CONT (MISCELLANEOUS) ×2 IMPLANT
BENZOIN TINCTURE PRP APPL 2/3 (GAUZE/BANDAGES/DRESSINGS) ×2 IMPLANT
BLADE SURG ROTATE 9660 (MISCELLANEOUS) IMPLANT
BUR MATCHSTICK NEURO 3.0 LAGG (BURR) ×2 IMPLANT
CANISTER SUCTION 2500CC (MISCELLANEOUS) ×2 IMPLANT
CLOTH BEACON ORANGE TIMEOUT ST (SAFETY) ×2 IMPLANT
CONT SPEC 4OZ CLIKSEAL STRL BL (MISCELLANEOUS) ×4 IMPLANT
COVER BACK TABLE 24X17X13 BIG (DRAPES) IMPLANT
COVER TABLE BACK 60X90 (DRAPES) ×2 IMPLANT
DRAPE C-ARM 42X72 X-RAY (DRAPES) ×4 IMPLANT
DRAPE LAPAROTOMY 100X72X124 (DRAPES) ×2 IMPLANT
DRAPE POUCH INSTRU U-SHP 10X18 (DRAPES) ×2 IMPLANT
DRAPE SURG 17X23 STRL (DRAPES) ×2 IMPLANT
DRESSING TELFA 8X3 (GAUZE/BANDAGES/DRESSINGS) ×2 IMPLANT
DRSG OPSITE 4X5.5 SM (GAUZE/BANDAGES/DRESSINGS) ×2 IMPLANT
DURAPREP 26ML APPLICATOR (WOUND CARE) ×2 IMPLANT
ELECT REM PT RETURN 9FT ADLT (ELECTROSURGICAL) ×2
ELECTRODE REM PT RTRN 9FT ADLT (ELECTROSURGICAL) ×1 IMPLANT
EVACUATOR 1/8 PVC DRAIN (DRAIN) ×2 IMPLANT
GAUZE SPONGE 4X4 16PLY XRAY LF (GAUZE/BANDAGES/DRESSINGS) IMPLANT
GLOVE BIO SURGEON STRL SZ8 (GLOVE) ×4 IMPLANT
GLOVE BIOGEL PI IND STRL 6.5 (GLOVE) ×3 IMPLANT
GLOVE BIOGEL PI INDICATOR 6.5 (GLOVE) ×3
GLOVE ECLIPSE 7.5 STRL STRAW (GLOVE) ×2 IMPLANT
GLOVE INDICATOR 7.0 STRL GRN (GLOVE) ×4 IMPLANT
GOWN BRE IMP SLV AUR LG STRL (GOWN DISPOSABLE) ×4 IMPLANT
GOWN BRE IMP SLV AUR XL STRL (GOWN DISPOSABLE) ×4 IMPLANT
GOWN STRL REIN 2XL LVL4 (GOWN DISPOSABLE) IMPLANT
HEMOSTAT POWDER KIT SURGIFOAM (HEMOSTASIS) IMPLANT
HEMOSTAT POWDER SURGIFOAM 1G (HEMOSTASIS) ×2 IMPLANT
KIT BASIN OR (CUSTOM PROCEDURE TRAY) ×2 IMPLANT
KIT ROOM TURNOVER OR (KITS) ×2 IMPLANT
MILL MEDIUM DISP (BLADE) ×2 IMPLANT
MIX DBX 10CC 35% BONE (Bone Implant) ×2 IMPLANT
NEEDLE HYPO 25X1 1.5 SAFETY (NEEDLE) ×2 IMPLANT
NS IRRIG 1000ML POUR BTL (IV SOLUTION) ×2 IMPLANT
PACK LAMINECTOMY NEURO (CUSTOM PROCEDURE TRAY) ×2 IMPLANT
PAD ARMBOARD 7.5X6 YLW CONV (MISCELLANEOUS) ×6 IMPLANT
ROD EXPEDIUM PREBENT 5.5X30MM (Rod) ×4 IMPLANT
SCREW EXPEDIUM POLYAXIAL 6X40 (Screw) ×8 IMPLANT
SCREW SET SINGLE INNER (Screw) ×8 IMPLANT
SPONGE LAP 4X18 X RAY DECT (DISPOSABLE) IMPLANT
SPONGE SURGIFOAM ABS GEL 100 (HEMOSTASIS) ×2 IMPLANT
STRIP CLOSURE SKIN 1/2X4 (GAUZE/BANDAGES/DRESSINGS) ×2 IMPLANT
SUT VIC AB 0 CT1 18XCR BRD8 (SUTURE) ×2 IMPLANT
SUT VIC AB 0 CT1 8-18 (SUTURE) ×2
SUT VIC AB 2-0 CP2 18 (SUTURE) ×2 IMPLANT
SUT VIC AB 3-0 SH 8-18 (SUTURE) ×4 IMPLANT
SYR 20ML ECCENTRIC (SYRINGE) ×2 IMPLANT
TELAMON 8X22 (Cage) ×2 IMPLANT
TOWEL OR 17X24 6PK STRL BLUE (TOWEL DISPOSABLE) ×2 IMPLANT
TOWEL OR 17X26 10 PK STRL BLUE (TOWEL DISPOSABLE) ×2 IMPLANT
TRAP SPECIMEN MUCOUS 40CC (MISCELLANEOUS) ×2 IMPLANT
TRAY FOLEY CATH 14FRSI W/METER (CATHETERS) ×2 IMPLANT
WATER STERILE IRR 1000ML POUR (IV SOLUTION) ×2 IMPLANT
WEDGE TANGENT 8X26MM ×2 IMPLANT

## 2012-04-02 NOTE — Anesthesia Postprocedure Evaluation (Signed)
  Anesthesia Post-op Note  Patient: Sheila Singleton  Procedure(s) Performed: Procedure(s) (LRB): POSTERIOR LUMBAR FUSION 1 LEVEL (N/A)  Patient Location: PACU  Anesthesia Type: General  Level of Consciousness: awake, alert  and oriented  Airway and Oxygen Therapy: Patient Spontanous Breathing and Patient connected to nasal cannula oxygen  Post-op Pain: mild  Post-op Assessment: Post-op Vital signs reviewed, Patient's Cardiovascular Status Stable, Respiratory Function Stable, Patent Airway, No signs of Nausea or vomiting and Pain level controlled  Post-op Vital Signs: Reviewed and stable  Complications: No apparent anesthesia complications

## 2012-04-02 NOTE — Progress Notes (Signed)
Utilization review completed.  

## 2012-04-02 NOTE — Anesthesia Procedure Notes (Signed)
Procedure Name: Intubation Date/Time: 04/02/2012 7:57 AM Performed by: Mancel Parsons Pre-anesthesia Checklist: Patient identified, Emergency Drugs available, Suction available, Patient being monitored and Timeout performed Patient Re-evaluated:Patient Re-evaluated prior to inductionOxygen Delivery Method: Circle system utilized Preoxygenation: Pre-oxygenation with 100% oxygen Intubation Type: IV induction Ventilation: Mask ventilation without difficulty Laryngoscope Size: Mac and 3 Grade View: Grade I Tube type: Oral Tube size: 7.0 mm Number of attempts: 1 Airway Equipment and Method: Stylet Placement Confirmation: ETT inserted through vocal cords under direct vision,  breath sounds checked- equal and bilateral and positive ETCO2 Secured at: 20 cm Tube secured with: Tape Dental Injury: Teeth and Oropharynx as per pre-operative assessment

## 2012-04-02 NOTE — Op Note (Signed)
04/02/2012  11:02 AM  PATIENT:  Sheila Singleton  66 y.o. 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 Decompressive lumbar laminectomy L4-5 requiring more work than would be required of the typical PLIF procedure in order to adequately decompress the neural elements.  2. Posterior lumbar interbody fusion L4-5 using a PEEK interbody cage packed with morcellized allograft and autograft and an allograft wedge.  3. Posterior fixation L4-5 using Depew pedicle screws.  4. Intertransverse arthrodesis L4-5 using morcellized autograft and allograft.  SURGEON:  Marikay Alar, MD  ASSISTANTS: Dr. Gerlene Fee   ANESTHESIA:  General  EBL: 250 ml  Total I/O In: 2500 [I.V.:2000; IV Piggyback:500] Out: 365 [Urine:115; Blood:250]  BLOOD ADMINISTERED:none  DRAINS: Hemovac   INDICATION FOR PROCEDURE: This patient presented with severe back and leg pain of a long duration with perceived leg weakness and difficulty with ambulation. MRI and plain films with flexion extension views showed a mobile spondylolisthesis at L4-5 with severe stenosis. I recommended a posterior lumbar interbody fusion at L4-5 to decompress the canal and at stability to the segment. Patient understood the risks, benefits, and alternatives and potential outcomes and wished to proceed.  PROCEDURE DETAILS:  The patient was brought to the operating room. After induction of generalized endotracheal anesthesia the patient was rolled into the prone position on chest rolls and all pressure points were padded. The patient's lumbar region was cleaned and then prepped with DuraPrep and draped in the usual sterile fashion. Anesthesia was injected and then a dorsal midline incision was made and carried down to the lumbosacral fascia. The fascia was opened and the paraspinous musculature was taken down in a subperiosteal fashion to expose  L4-5. Intraoperative fluoroscopy confirmed my level, and the spinous process was removed and complete lumbar laminectomies, hemi- facetectomies, and foraminotomies were performed at L4-5. The yellow ligament was removed to expose the underlying dura and nerve roots, and generous foraminotomies were performed to adequately decompress the neural elements. Once the decompression was complete, I turned my attention to the posterior lower lumbar interbody fusion. The epidural venous vasculature was coagulated and cut sharply. Disc space was incised and the initial discectomy was performed with pituitary rongeurs. The disc space was distracted with sequential distractors to a height of 8 mm. We then used a series of scrapers and shavers to prepare the endplates for fusion. The midline was prepared with Epstein curettes. Once the complete discectomy was finished, we packed an appropriate sized peek interbody cage with local autograft and morcellized allograft, gently retracted the nerve root, and tapped the cage into position at L4-5. We also tapped a tangent interbody bone wedge into the opposite side. The midline was packed with morselized autograft and allograft. We then turned our attention to the posterior fixation. The pedicle screw entry zones were identified utilizing surface landmarks and fluoroscopy. We probed each pedicle with the pedicle probe and tapped each pedicle with the appropriate tap. We palpated with a ball probe to assure no break in the cortex. We then placed 6-0 Vicryl for the millimeter pedicle screws into the pedicles bilaterally at L4-5. We then decorticated the transverse processes and laid a mixture of morcellized autograft and allograft out over these to perform intertransverse arthrodesis at L4-5. We then placed lordotic rods into the multiaxial screw heads of the pedicle screws and locked these in position with the locking caps and anti-torque device. We achieved  compression of our grafts.  We then checked our construct with AP and lateral fluoroscopy. Irrigated with copious amounts of bacitracin-containing saline solution. Placed a medium Hemovac drain through separate stab incision. Inspected the nerve roots once again to assure adequate decompression, lined to the dura with Gelfoam, and closed the muscle and the fascia with 0 Vicryl. Closed the subcutaneous tissues with 2-0 Vicryl and subcuticular tissues with 3-0 Vicryl. The skin was closed with benzoin and Steri-Strips. Dressing was then applied, the patient was awakened from general anesthesia and transported to the recovery room in stable condition. At the end of the procedure all sponge, needle and instrument counts were correct.   PLAN OF CARE: Admit to inpatient   PATIENT DISPOSITION:  PACU - hemodynamically stable.   Delay start of Pharmacological VTE agent (>24hrs) due to surgical blood loss or risk of bleeding:  yes

## 2012-04-02 NOTE — Transfer of Care (Signed)
Immediate Anesthesia Transfer of Care Note  Patient: Sheila Singleton  Procedure(s) Performed: Procedure(s) (LRB): POSTERIOR LUMBAR FUSION 1 LEVEL (N/A)  Patient Location: PACU  Anesthesia Type: General  Level of Consciousness: awake and alert   Airway & Oxygen Therapy: Patient Spontanous Breathing and Patient connected to nasal cannula oxygen  Post-op Assessment: Report given to PACU RN and Post -op Vital signs reviewed and stable  Post vital signs: Reviewed and stable  Complications: No apparent anesthesia complications

## 2012-04-02 NOTE — H&P (Signed)
Subjective: Patient is a 66 y.o. female admitted for PLIF L4-5. Onset of symptoms was many months ago, progressively worse since that time.  The pain is rated severe with trouble walking secondary to pain and weakness, and is located in the back and radiates to legs. The pain is described as aching and occurs all day. The symptoms have been progressive. Symptoms are exacerbated by walking and standing. 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 TEDs and PCH to be used peri-operatively.  Past Medical History  Diagnosis Date  . PONV (postoperative nausea and vomiting)     DIFFICULTY AWAKENING  . Diabetes mellitus   . Hypothyroidism   . GERD (gastroesophageal reflux disease)   . DVT (deep venous thrombosis)     post operative x 2    Past Surgical History  Procedure Date  . Appendectomy   . Thyroid surgery     LEFT  . Oophorectomy   . Tubal ligation     Prior to Admission medications   Medication Sig Start Date End Date Taking? Authorizing Provider  acetaminophen (TYLENOL) 650 MG suppository Place 1,300 mg rectally 2 (two) times daily.   Yes Historical Provider, MD  beta carotene w/minerals (OCUVITE) tablet Take 1 tablet by mouth daily.   Yes Historical Provider, MD  cholecalciferol (VITAMIN D) 1000 UNITS tablet Take 1,000 Units by mouth daily.   Yes Historical Provider, MD  DULoxetine (CYMBALTA) 60 MG capsule Take 60 mg by mouth daily.   Yes Historical Provider, MD  glipiZIDE (GLUCOTROL) 10 MG tablet Take 20 mg by mouth daily.   Yes Historical Provider, MD  levothyroxine (SYNTHROID, LEVOTHROID) 175 MCG tablet Take 175 mcg by mouth daily.   Yes Historical Provider, MD  Multiple Vitamins-Minerals (MULTIVITAMIN GUMMIES ADULT) CHEW Chew 2 tablets by mouth daily.   Yes Historical Provider, MD  omeprazole (PRILOSEC) 20 MG capsule Take 20 mg  by mouth daily.   Yes Historical Provider, MD  pioglitazone (ACTOS) 30 MG tablet Take 30 mg by mouth daily.   Yes Historical Provider, MD  warfarin (COUMADIN) 3 MG tablet Take 3 mg by mouth See admin instructions. Mon, tues, wednes, thurs, fri   Yes Historical Provider, MD  warfarin (COUMADIN) 4 MG tablet Take 4 mg by mouth See admin instructions. On Saturday and sunday   Yes Historical Provider, MD   Allergies  Allergen Reactions  . Tramadol Shortness Of Breath  . Metformin And Related Diarrhea and Nausea Only  . Penicillins Swelling    History  Substance Use Topics  . Smoking status: Not on file  . Smokeless tobacco: Not on file  . Alcohol Use: No    History reviewed. No pertinent family history.   Review of Systems  Positive ROS: DVT  All other systems have been reviewed and were otherwise negative with the exception of those mentioned in the HPI and as above.  Objective: Vital signs in last 24 hours: Temp:  [98 F (36.7 C)] 98 F (36.7 C) (04/25 4540) Pulse Rate:  [88] 88  (04/25 0613) Resp:  [18] 18  (04/25 0613) BP: (149)/(82) 149/82 mmHg (04/25 0613) SpO2:  [99 %] 99 % (04/25 9811)  General Appearance: Alert, cooperative, no distress, appears stated age Head: Normocephalic, without obvious abnormality, atraumatic Eyes: PERRL, conjunctiva/corneas clear, EOM's intact, fundi benign, both eyes      Ears: Normal TM's and external  ear canals, both ears Throat: Lips, mucosa, and tongue normal; teeth and gums normal Neck: Supple, symmetrical, trachea midline, no adenopathy; thyroid: No enlargement/tenderness/nodules; no carotid bruit or JVD Back: Symmetric, no curvature, ROM normal, no CVA tenderness Lungs: Clear to auscultation bilaterally, respirations unlabored Heart: Regular rate and rhythm, S1 and S2 normal, no murmur, rub or gallop Abdomen: Soft, non-tender, bowel sounds active all four quadrants, no masses, no organomegaly Extremities: Extremities normal,  atraumatic, no cyanosis or edema Pulses: 2+ and symmetric all extremities Skin: Skin color, texture, turgor normal, no rashes or lesions  NEUROLOGIC:   Mental status: Alert and oriented x4,  no aphasia, good attention span, fund of knowledge, and memory Motor Exam - diffuse mild weakness BLE Sensory Exam - grossly normal Reflexes: trace Coordination - grossly normal Gait - antalgic, uses assistance Balance - grossly normal Cranial Nerves: I: smell Not tested  II: visual acuity  OS: nl    OD: nl  II: visual fields Full to confrontation  II: pupils Equal, round, reactive to light  III,VII: ptosis None  III,IV,VI: extraocular muscles  Full ROM  V: mastication Normal  V: facial light touch sensation  Normal  V,VII: corneal reflex  Present  VII: facial muscle function - upper  Normal  VII: facial muscle function - lower Normal  VIII: hearing Not tested  IX: soft palate elevation  Normal  IX,X: gag reflex Present  XI: trapezius strength  5/5  XI: sternocleidomastoid strength 5/5  XI: neck flexion strength  5/5  XII: tongue strength  Normal    Data Review Lab Results  Component Value Date   WBC 8.8 03/26/2012   HGB 13.0 03/26/2012   HCT 38.2 03/26/2012   MCV 92.5 03/26/2012   PLT 328 03/26/2012   Lab Results  Component Value Date   NA 139 03/26/2012   K 3.9 03/26/2012   CL 103 03/26/2012   CO2 24 03/26/2012   BUN 15 03/26/2012   CREATININE 0.81 03/26/2012   GLUCOSE 104* 03/26/2012   Lab Results  Component Value Date   INR 1.15 03/26/2012    Assessment/Plan: Patient admitted for PLIF L4-5 for severe stenosis with spondylolisthesis. Patient has failed conservative therapy.  I explained the condition and procedure to the patient and answered any questions. Risks of DVT are high. Filter placed, will give lovenox post-op, but risk of bleeding complication in the early post-op phase limits my ability to be too aggressive with anti-coagulation. Patient understands all of this and  its implications as we have discussed it at length and in great detail. Patient wishes to proceed with procedure as planned. Understands risks/ benefits and typical outcomes of procedure.   Katalyna Socarras S 04/02/2012 6:35 AM

## 2012-04-02 NOTE — Preoperative (Signed)
Beta Blockers   Reason not to administer Beta Blockers:Not Applicable. No home beta blockers 

## 2012-04-02 NOTE — Anesthesia Preprocedure Evaluation (Addendum)
Anesthesia Evaluation  Patient identified by MRN, date of birth, ID band Patient awake    Reviewed: Allergy & Precautions, H&P , NPO status , Patient's Chart, lab work & pertinent test results, reviewed documented beta blocker date and time   History of Anesthesia Complications (+) PONV  Airway Mallampati: II TM Distance: >3 FB Neck ROM: Full    Dental  (+) Teeth Intact and Dental Advisory Given   Pulmonary  breath sounds clear to auscultation        Cardiovascular Rhythm:Regular Rate:Normal     Neuro/Psych    GI/Hepatic GERD-  Medicated and Controlled,  Endo/Other  Diabetes mellitus-, Well Controlled, Oral Hypoglycemic AgentsHypothyroidism Morbid obesity  Renal/GU      Musculoskeletal   Abdominal   Peds  Hematology   Anesthesia Other Findings   Reproductive/Obstetrics                          Anesthesia Physical Anesthesia Plan  ASA: III  Anesthesia Plan: General   Post-op Pain Management:    Induction: Intravenous  Airway Management Planned: Oral ETT  Additional Equipment:   Intra-op Plan:   Post-operative Plan: Extubation in OR  Informed Consent: I have reviewed the patients History and Physical, chart, labs and discussed the procedure including the risks, benefits and alternatives for the proposed anesthesia with the patient or authorized representative who has indicated his/her understanding and acceptance.   Dental advisory given  Plan Discussed with: CRNA, Anesthesiologist and Surgeon  Anesthesia Plan Comments:         Anesthesia Quick Evaluation

## 2012-04-03 LAB — GLUCOSE, CAPILLARY
Glucose-Capillary: 140 mg/dL — ABNORMAL HIGH (ref 70–99)
Glucose-Capillary: 151 mg/dL — ABNORMAL HIGH (ref 70–99)

## 2012-04-03 MED ORDER — INSULIN ASPART 100 UNIT/ML ~~LOC~~ SOLN: 0.0000 [IU] | Freq: Three times a day (TID) | SUBCUTANEOUS | Status: DC

## 2012-04-03 NOTE — Progress Notes (Signed)
   CARE MANAGEMENT NOTE 04/03/2012  Patient:  Sheila Singleton, Sheila Singleton   Account Number:  [id number]  Date Initiated:  04/03/2012  Documentation initiated by:  Yi Haugan  Subjective/Objective Assessment:   Order for [facility] and DME     Action/Plan:   No HH needs per PT/OT eval, pt has RW and request hospital bed. [facility] to eval for hospital bed and/or provide if pt agreeable to rental   Anticipated DC Date:  04/04/2012   Anticipated DC Plan:  HOME/SELF CARE         Choice offered to / List presented to:     DME arranged  HOSPITAL BED      DME agency  [facility]        Status of service:  Completed, signed off Medicare Important Message given?   (If response is "NO", the following Medicare IM given date fields will be blank) Date Medicare IM given:   Date Additional Medicare IM given:    Discharge Disposition:  HOME/SELF CARE  Per UR Regulation:    If discussed at Long Length of Stay Meetings, dates discussed:    Comments:

## 2012-04-03 NOTE — Progress Notes (Signed)
Inpatient Diabetes Program Recommendations  AACE/ADA: New Consensus Statement on Inpatient Glycemic Control (2009)  Target Ranges:  Prepandial:   less than 140 mg/dL      Peak postprandial:   less than 180 mg/dL (1-2 hours)      Critically ill patients:  140 - 180 mg/dL   Reason for Visit: Results for Sheila Singleton, Sheila Singleton (MRN [id number]) as of 04/03/2012 10:32  Ref. Range 04/02/2012 06:17 04/02/2012 11:23 04/02/2012 16:37 04/03/2012 06:50  Glucose-Capillary Latest Range: 70-99 mg/dL 409 (H) 811 (H) 914 (H) 184 (H)   Note patient has history of diabetes. Inpatient Diabetes Program Recommendations Correction (SSI): Please add Novolog moderate correction tid with meals and HS (per glycemic control order set).  Note: Will follow.

## 2012-04-03 NOTE — Evaluation (Signed)
Agree with student PT evaluation.  Roston Grunewald, PT DPT [phone]  

## 2012-04-03 NOTE — Evaluation (Signed)
Occupational Therapy Evaluation Patient Details Name: Sheila Singleton MRN: [id number] DOB: 1946-11-04 Today's Date: 04/03/2012 Time: 8295-6213 OT Time Calculation (min): 27 min  OT Assessment / Plan / Recommendation Clinical Impression  66 yo female s/p L4-L5 PLIF that could benefit from skilled OT acutely. No OT follow up recommended for d/c planning    OT Assessment  Patient needs continued OT Services    Follow Up Recommendations  No OT follow up    Equipment Recommendations  None recommended by OT    Frequency Min 2X/week    Precautions / Restrictions Precautions Precautions: Back (pt currently with drain) Precaution Booklet Issued: Yes (comment) Precaution Comments: Issued handout and reviewed 3/3 back precuations with patient. Required Braces or Orthoses: Spinal Brace Spinal Brace: Lumbar corset;Applied in sitting position Restrictions Weight Bearing Restrictions: No   Pertinent Vitals/Pain     ADL  Eating/Feeding: Performed;Modified independent Where Assessed - Eating/Feeding: Chair Grooming: Performed;Wash/dry hands;Wash/dry face;Set up Where Assessed - Grooming: Supported sitting Upper Body Dressing: Performed;Set up Where Assessed - Upper Body Dressing: Sitting, chair;Unsupported Lower Body Dressing: Performed;Minimal assistance Where Assessed - Lower Body Dressing: Sitting, chair (required AE) Toilet Transfer: Simulated;Minimal assistance Toilet Transfer Method: Stand pivot Toilet Transfer Equipment: Raised toilet seat with arms (or 3-in-1 over toilet) Toileting - Clothing Manipulation: Simulated;Minimal assistance Where Assessed - Toileting Clothing Manipulation: Sit to stand from 3-in-1 or toilet Toileting - Hygiene: Simulated;Moderate assistance Where Assessed - Toileting Hygiene: Sit to stand from 3-in-1 or toilet Equipment Used: Gait belt;Back brace;Rolling walker ADL Comments: Pt educated on back precautions with ADLs. Pt educated on AE and return  demonstrating. Recommend next session address tub transfer with seat and peri hygiene if pt has purchased AE    OT Goals Acute Rehab OT Goals OT Goal Formulation: With patient Time For Goal Achievement: 04/10/12 Potential to Achieve Goals: Good ADL Goals Pt Will Perform Lower Body Dressing: with modified independence;Sit to stand from bed;Sit to stand from chair;with adaptive equipment ADL Goal: Lower Body Dressing - Progress: Goal set today Pt Will Perform Toileting - Hygiene: with modified independence;Sit to stand from 3-in-1/toilet;with adaptive equipment ADL Goal: Toileting - Hygiene - Progress: Goal set today Pt Will Perform Tub/Shower Transfer: with supervision;Shower seat with back;Grab bars;Ambulation ADL Goal: Tub/Shower Transfer - Progress: Goal set today  Visit Information  Last OT Received On: 04/03/12 Assistance Needed: +1    Subjective Data  Subjective: Please take some candy before you go Patient Stated Goal: to be able to reach feet and peri hygiene areas   Prior Functioning  Home Living Lives With: Family Available Help at Discharge: Family Type of Home: House Home Access: Stairs to enter Secretary/administrator of Steps: 4-5 Entrance Stairs-Rails: Right;Left Home Layout: Two level Alternate Level Stairs-Number of Steps: 13 Alternate Level Stairs-Rails: Left Bathroom Shower/Tub: Tub/shower unit;Curtain Bathroom Toilet: Standard Bathroom Accessibility: Yes How Accessible: Accessible via walker Home Adaptive Equipment: Crutches;Grab bars around toilet;Grab bars in shower;Hand-held shower hose;Shower chair without back;Straight cane Additional Comments: Pt has disccued with physician getting a hospital bed on the first level.  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, building services) Communication Communication: No difficulties Dominant Hand: Right    Cognition  Overall Cognitive Status: Appears within functional limits for tasks assessed/performed Arousal/Alertness: Awake/alert Orientation Level: Oriented X4 / Intact Behavior During  Session: [facility] for tasks performed    Extremity/Trunk Assessment Right Upper Extremity Assessment RUE ROM/Strength/Tone: Within functional levels;WFL for tasks assessed RUE Sensation: WFL - Light Touch RUE Coordination: WFL - gross/fine motor Left Upper Extremity Assessment LUE ROM/Strength/Tone: Within functional levels;WFL for tasks assessed LUE Sensation: WFL - Light Touch LUE Coordination: WFL - gross/fine motor Right Lower Extremity Assessment RLE ROM/Strength/Tone: WFL for tasks assessed RLE Coordination: WFL - gross/fine motor Left Lower Extremity Assessment LLE ROM/Strength/Tone: WFL for tasks assessed LLE Coordination: WFL - gross/fine motor Trunk Assessment Trunk Assessment: Normal   Mobility Bed Mobility Bed Mobility: Rolling Left;Left Sidelying to Sit;Sitting - Scoot to [place] of Bed Rolling Left: 4: Min guard Left Sidelying to Sit: 4: Min guard Sitting - Scoot to [place] of Bed: 6: Modified independent (Device/Increase time) Details for Bed Mobility Assistance: Pt required min assist with bed mobility for safety with VC for sequencing to maintain back precautions. Transfers Transfers: Sit to Stand;Stand to Sit Sit to Stand: 4: Min assist;From bed;With upper extremity assist Stand to Sit: 4: Min guard;With upper extremity assist;To chair/3-in-1;With armrests Details for Transfer Assistance: (A) to control descend to chair   Exercise    Balance Balance Balance Assessed: Yes Static Sitting Balance Static Sitting - Balance Support: Bilateral upper extremity supported;Feet supported Static Sitting - Level of Assistance: 5: Stand by assistance Static  Sitting - Comment/# of Minutes: ~5 minutes.  Pt required stand by assistance for safety.  End of Session OT - End of Session Equipment Utilized During Treatment: Gait belt;Back brace Activity Tolerance: Patient tolerated treatment well Patient left: in chair;with call bell/phone within reach Nurse Communication: Mobility status   Lucile Shutters 04/03/2012, 2:41 PM Pager: [phone]

## 2012-04-03 NOTE — Clinical Documentation Improvement (Signed)
BMI DOCUMENTATION CLARIFICATION QUERY  THIS DOCUMENT IS NOT A PERMANENT PART OF THE MEDICAL RECORD         04/03/12  Dear Dr. Yetta Barre Marton Redwood  In an effort to better capture your patient's severity of illness, reflect appropriate length of stay and utilization of resources, a review of the patient medical record has revealed the following indicators.   Based on your clinical judgment, please clarify and document in a progress note and/or discharge summary the clinical condition associated with the following supporting information: In responding to this query please exercise your independent judgment.  The fact that a query is asked, does not imply that any particular answer is desired or expected.  According to the documented Height and Weight in CHL/EPIC, the patients BMI is greater than 40. If your clinical findings/judgment agrees with this, please document this along with the related diagnosis in the progress note and discharge summary. THANK YOU!    BEST PRACTICE: A diagnosis of UNDERWEIGHT or MORBID OBESITY should have the BMI documented along with it.  Possible Clinical Conditions?  - Morbid Obesity  - Other condition (please document in the progress notes and/or discharge summary)  - Cannot Clinically determine at this time   Supporting Information:  04/02/12 Weight: 245lbs Height 5'3" BMI= 43.5     Reviewed: additional documentation in the medical record  Thank You,  Saul Fordyce  Clinical Documentation Specialist: [phone] Pager  Health Information Management [place]    TO RESPOND TO THE THIS QUERY, FOLLOW THE INSTRUCTIONS BELOW:  1. If needed, update documentation for the patient's encounter via the notes activity.  2. Access this query again and click edit on the In [employer].  3. After updating, or not, click F2 to complete all highlighted (required) fields concerning your review. Select "additional documentation in the medical  record" OR "no additional documentation provided".  4. Click Sign note button.  5. The deficiency will fall out of your In Basket *Please let us know if you are not able to complete this workflow by phone or e-mail (listed below).

## 2012-04-03 NOTE — Progress Notes (Signed)
Patient ID: Sheila Singleton, female   DOB: 10/02/1946, 66 y.o.   MRN: [id number] Subjective: Patient reports she is doing well. approp back soreness. No leg pain.  Objective: Vital signs in last 24 hours: Temp:  [97.5 F (36.4 C)-98.2 F (36.8 C)] 98.2 F (36.8 C) (04/26 1022) Pulse Rate:  [80-100] 80  (04/26 1022) Resp:  [18-25] 18  (04/26 1022) BP: (107-162)/(50-76) 120/50 mmHg (04/26 1022) SpO2:  [95 %-98 %] 95 % (04/26 1022) Weight:  [111.131 kg (245 lb)] 111.131 kg (245 lb) (04/25 1757)  Intake/Output from previous day: 04/25 0701 - 04/26 0700 In: 4275 [P.O.:600; I.V.:2875; IV Piggyback:500] Out: 1715 [Urine:1365; Drains:100; Blood:250] Intake/Output this shift:    Neurologic: Grossly normal with good DF/PF  Lab Results: Lab Results  Component Value Date   WBC 8.8 03/26/2012   HGB 13.0 03/26/2012   HCT 38.2 03/26/2012   MCV 92.5 03/26/2012   PLT 328 03/26/2012   Lab Results  Component Value Date   INR 1.15 03/26/2012   BMET Lab Results  Component Value Date   NA 139 03/26/2012   K 3.9 03/26/2012   CL 103 03/26/2012   CO2 24 03/26/2012   GLUCOSE 104* 03/26/2012   BUN 15 03/26/2012   CREATININE 0.81 03/26/2012   CALCIUM 9.4 03/26/2012    Studies/Results: Dg Lumbar Spine 2-3 Views  04/02/2012  *RADIOLOGY REPORT*  Clinical Data: Back pain  LUMBAR SPINE - 2-3 VIEW,C-ARM 1-60 MIN  Comparison: None.  Findings: C-arm films document L4-L5 PLIF with pedicle screws and interbody cage.  No gross adverse features.  IMPRESSION: As above.  Original Report Authenticated By: Elsie Stain, M.D.   Dg C-arm 1-60 Min  04/02/2012  *RADIOLOGY REPORT*  Clinical Data: Back pain  LUMBAR SPINE - 2-3 VIEW,C-ARM 1-60 MIN  Comparison: None.  Findings: C-arm films document L4-L5 PLIF with pedicle screws and interbody cage.  No gross adverse features.  IMPRESSION: As above.  Original Report Authenticated By: Elsie Stain, M.D.    Assessment/Plan: Doing well, mobilize.   LOS: 1 day     Alyia Lacerte S 04/03/2012, 11:31 AM

## 2012-04-03 NOTE — Evaluation (Signed)
Physical Therapy Evaluation Patient Details Name: Sheila Singleton MRN: [id number] DOB: [birth year] Today's Date: 04/03/2012 Time: 4098-1191 PT Time Calculation (min): 34 min  PT Assessment / Plan / Recommendation Clinical Impression  Pt is a 66 y.o. female admitted s/p L4-L5 PLIF with resulting pain, decreased mobility, decreased awareness of precautions, impaired balance, and impaired gait.  Pt will benefit from skilled physical therapy in the acute care setting in order to address these impairments and prepare for d/c home.    PT Assessment  Patient needs continued PT services    Follow Up Recommendations  No PT follow up (Discuss OPPT with physician if appropriate)    Equipment Recommendations  Rolling walker with 5" wheels    Frequency Min 5X/week    Precautions / Restrictions Precautions Precautions: Back Precaution Booklet Issued: Yes (comment) Precaution Comments: Issued handout and reviewed 3/3 back precuations with patient. Required Braces or Orthoses: Spinal Brace Spinal Brace: Lumbar corset;Applied in sitting position Restrictions Weight Bearing Restrictions: No   Pertinent Vitals/Pain       Mobility  Bed Mobility Bed Mobility: Rolling Left;Left Sidelying to Sit;Sitting - Scoot to [place] of Bed Rolling Left: 4: Min guard Left Sidelying to Sit: 4: Min guard Sitting - Scoot to [place] of Bed: 6: Modified independent (Device/Increase time) Details for Bed Mobility Assistance: Pt required min assist with bed mobility for safety with VC for sequencing to maintain back precautions. Transfers Transfers: Sit to Stand;Stand to Sit Sit to Stand: 4: Min assist;From bed;With upper extremity assist Stand to Sit: 4: Min assist;To chair/3-in-1;With upper extremity assist;With armrests Details for Transfer Assistance: Pt required min assist for sit>stand transfers to initiate transfer.  Pt requiredmin assist for stand>sit transfers to control descent into chair.  Pt required VC for  hand placement with RW. Ambulation/Gait Ambulation/Gait Assistance: 4: Min assist Ambulation Distance (Feet): 150 Feet Assistive device: Rolling walker Ambulation/Gait Assistance Details: Pt required min assist for safety.  Pt paused multiple times during ambulation to rest of a few seconds. Gait Pattern: Step-to pattern;Decreased stride length Gait velocity: Decreased Stairs: No    Exercises     PT Goals Acute Rehab PT Goals PT Goal Formulation: With patient Time For Goal Achievement: 04/10/12 Potential to Achieve Goals: Good Pt will Roll Supine to Right Side: Independently;with rail PT Goal: Rolling Supine to Right Side - Progress: Goal set today Pt will Roll Supine to Left Side: Independently;with rail PT Goal: Rolling Supine to Left Side - Progress: Goal set today Pt will go Sit to Supine/Side: Independently;with rail PT Goal: Sit to Supine/Side - Progress: Goal set today Pt will go Sit to Stand: with modified independence;with upper extremity assist PT Goal: Sit to Stand - Progress: Goal set today Pt will go Stand to Sit: with modified independence;with upper extremity assist PT Goal: Stand to Sit - Progress: Goal set today Pt will Ambulate: >150 feet;with supervision;with least restrictive assistive device PT Goal: Ambulate - Progress: Goal set today Pt will Go Up / Down Stairs: Flight;with supervision;with rail(s) PT Goal: Up/Down Stairs - Progress: Goal set today Additional Goals Additional Goal #1: Pt will be able to describe and adhere to 3/3 back precuations throughout PT treatment. PT Goal: Additional Goal #1 - Progress: Goal set today  Visit Information  Last PT Received On: 04/03/12 Assistance Needed: +1    Subjective Data  Subjective: I have 2/10 back pain.   Prior Functioning  Home Living Lives With: Family (Husband, daughter, mother) Available Help at Discharge: Family Type of Home:  House Home Access: Stairs to enter [employer] of Steps:  4-5 Entrance Stairs-Rails: Right;Left Home Layout: Two level Alternate Level Stairs-Number of Steps: 13 Alternate Level Stairs-Rails: Left Bathroom Shower/Tub: Tub/shower unit;Curtain Bathroom Toilet: Standard Bathroom Accessibility: Yes How Accessible: Accessible via walker Home Adaptive Equipment: Crutches;Grab bars around toilet;Grab bars in shower;Hand-held shower hose;Shower chair without back;Straight cane Additional Comments: Pt has disccued with physician getting a hospital bed on the first level.  Only a half bath on the first level Prior Function Level of Independence: Independent with assistive device(s);Needs assistance Needs Assistance: Dressing;Meal Prep Dressing: Minimal (Socks) Meal Prep: Minimal Able to Take Stairs?: Yes Driving: Yes Communication Communication: No difficulties    Cognition  Overall Cognitive Status: Appears within functional limits for tasks assessed/performed Arousal/Alertness: Awake/alert Orientation Level: Oriented X4 / Intact Behavior During Session: [facility] for tasks performed    Extremity/Trunk Assessment Right Lower Extremity Assessment RLE ROM/Strength/Tone: WFL for tasks assessed RLE Coordination: WFL - gross/fine motor Left Lower Extremity Assessment LLE ROM/Strength/Tone: WFL for tasks assessed LLE Coordination: WFL - gross/fine motor Trunk Assessment Trunk Assessment: Normal   Balance Balance Balance Assessed: Yes Static Sitting Balance Static Sitting - Balance Support: Bilateral upper extremity supported;Feet supported Static Sitting - Level of Assistance: 5: Stand by assistance Static Sitting - Comment/# of Minutes: ~5 minutes.  Pt required stand by assistance for safety.  End of Session PT - End of Session Equipment Utilized During Treatment: Gait belt;Back brace Activity Tolerance: Patient tolerated treatment well Patient left: in chair;with call bell/phone within reach   Ezzard Standing SPT 04/03/2012, 1:37 PM

## 2012-04-04 LAB — GLUCOSE, CAPILLARY
Glucose-Capillary: 52 mg/dL — ABNORMAL LOW (ref 70–99)
Glucose-Capillary: 57 mg/dL — ABNORMAL LOW (ref 70–99)
Glucose-Capillary: 58 mg/dL — ABNORMAL LOW (ref 70–99)

## 2012-04-04 MED ORDER — DOCUSATE SODIUM 100 MG PO CAPS
100.0000 mg | ORAL_CAPSULE | Freq: Two times a day (BID) | ORAL | Status: DC
  Administered 2012-04-04 – 2012-04-05 (×2): 100 mg via ORAL
  Filled 2012-04-04: qty 1

## 2012-04-04 NOTE — Progress Notes (Signed)
Subjective: Patient reports feeling better, still progressing with PT  Objective: Vital signs in last 24 hours: Temp:  [97.9 F (36.6 C)-98.3 F (36.8 C)] 98 F (36.7 C) (04/27 0936) Pulse Rate:  [80-109] 89  (04/27 0936) Resp:  [18-20] 20  (04/27 0936) BP: (105-114)/(47-60) 106/50 mmHg (04/27 0936) SpO2:  [94 %-100 %] 95 % (04/27 0936)  Intake/Output from previous day: 04/26 0701 - 04/27 0700 In: 763 [P.O.:760; I.V.:3] Out: 120 [Drains:120] Intake/Output this shift:    Physical Exam: Full strength bilateral lower extremities.    Lab Results: No results found for this basename: WBC:2,HGB:2,HCT:2,PLT:2 in the last 72 hours BMET No results found for this basename: NA:2,K:2,CL:2,CO2:2,GLUCOSE:2,BUN:2,CREATININE:2,CALCIUM:2 in the last 72 hours  Studies/Results: No results found.  Assessment/Plan: D/C Drain.  Mobilizing with PT.  Will require hospital bed for chronic pain due to lumbar spondylolisthesis.  Hospital bed will allow patient to change positions in ways not achievable with a normal bed in the postoperative period.  Her bedroom is on second floor and the hospital bed will be placed on the ground floor.  This will assist patient, who is currently unable to go up and down stairs.     LOS: 2 days    Dorian Heckle, MD 04/04/2012, 11:29 AM

## 2012-04-04 NOTE — Progress Notes (Signed)
Patient ID: Sheila Singleton, female   DOB: November 06, 1946, 66 y.o.   MRN: [id number] Came to see the patient. She looks great. She is really having minimal pain.. mobilization strength is getting better. Likely home tomorrow. She is very happy and very thankful.

## 2012-04-04 NOTE — Progress Notes (Signed)
Occupational Therapy Treatment Patient Details Name: Sheila Singleton MRN: [id number] DOB: [birth year] Today's Date: 04/04/2012 Time: 4098-1191 OT Time Calculation (min): 21 min  OT Assessment / Plan / Recommendation Comments on Treatment Session      Follow Up Recommendations  No OT follow up    Equipment Recommendations  None recommended by OT    Frequency Min 2X/week      Precautions / Restrictions Precautions Precautions: Back Precaution Booklet Issued: Yes (comment) Precaution Comments: Issued handout and reviewed 3/3 back precuations with patient. Required Braces or Orthoses: Spinal Brace Spinal Brace: Lumbar corset;Applied in sitting position Restrictions Weight Bearing Restrictions: No   Pertinent Vitals/Pain 2/10 in back    ADL  Grooming: Performed;Set up;Supervision/safety Where Assessed - Grooming: Standing at sink Toilet Transfer: Buyer, retail Method: Surveyor, minerals: Other (comment) (straight back chair) Toileting - Hygiene: Simulated;Minimal assistance Where Assessed - Toileting Hygiene: Sit to stand from 3-in-1 or toilet Equipment Used: Gait belt;Back brace;Rolling walker ADL Comments: Pt. provided with tub transfer bench education and pt. able to recall technique. Pt. able to recall use of toilet hygiene aid and reports daughter will be getting her one for home use. Pt. moving well today and completed ~50' with RW and close supervision due to feeling "weak"    OT Goals Acute Rehab OT Goals OT Goal Formulation: With patient Time For Goal Achievement: 04/10/12 Potential to Achieve Goals: Good ADL Goals Pt Will Perform Toileting - Hygiene: with modified independence;Sit to stand from 3-in-1/toilet;with adaptive equipment ADL Goal: Toileting - Hygiene - Progress: Progressing toward goals Pt Will Perform Tub/Shower Transfer: with supervision;Shower seat with back;Grab bars;Ambulation ADL Goal: Tub/Shower  Transfer - Progress: Progressing toward goals  Visit Information  Last OT Received On: 04/04/12 Assistance Needed: +1    Subjective Data  Subjective: "I think I want to walk some" Patient Stated Goal: Go home      Cognition  Overall Cognitive Status: Appears within functional limits for tasks assessed/performed Arousal/Alertness: Awake/alert Orientation Level: Oriented X4 / Intact Behavior During Session: [facility] for tasks performed    Mobility Transfers Sit to Stand: 5: Supervision;With armrests;From chair/3-in-1 Details for Transfer Assistance: Min verbal cues for hand placement         End of Session OT - End of Session Equipment Utilized During Treatment: Gait belt;Back brace Activity Tolerance: Patient tolerated treatment well Patient left: in chair;with call bell/phone within reach Nurse Communication: Mobility status   Cassandria Anger, OTR/L Pager [phone] 04/04/2012, 12:27 PM

## 2012-04-04 NOTE — Progress Notes (Signed)
Physical Therapy Treatment Patient Details Name: Sheila Singleton MRN: [id number] DOB: 05/05/1946 Today's Date: 04/04/2012 Time: 7425-9563 PT Time Calculation (min): 31 min  PT Assessment / Plan / Recommendation Comments on Treatment Session  Pt progessing nicely, able to perform steps with min assist however would like to practice stairs again prior to D/C. Will attempt to see pt again in AM.     Follow Up Recommendations  No PT follow up (Discuss OPPT with physician if appropriate)    Equipment Recommendations  Rolling walker with 5" wheels    Frequency Min 5X/week   Plan Discharge plan remains appropriate    Precautions / Restrictions Precautions Precautions: Back Precaution Booklet Issued: Yes (comment) Precaution Comments: Reviewed 3/3 back precuations with patient, pt requires min cues for precautions Required Braces or Orthoses: Spinal Brace Spinal Brace: Lumbar corset;Applied in sitting position Restrictions Weight Bearing Restrictions: No   Pertinent Vitals/Pain 3/10 back pain during session    Mobility  Transfers Transfers: Sit to Stand Sit to Stand: With upper extremity assist;From chair/3-in-1;4: Min guard Stand to Sit: With upper extremity assist;To chair/3-in-1;With armrests;5: Supervision Details for Transfer Assistance: Verbal cues for safe hand placement Ambulation/Gait Ambulation/Gait Assistance: 4: Min assist Assistive device: Rolling walker Ambulation/Gait Assistance Details: Tactile cues for improved Lt. pelvic advancement with Lt. LE stepping to promote normalized gait. Verbal cues for upright posture, equal stance time and step length with bil. LEs.   Gait Pattern: Step-to pattern;Decreased stance time - left;Trunk flexed Stairs: Yes Stairs Assistance: 4: Min assist Stairs Assistance Details (indicate cue type and reason): min assist with descent, verbal cues for safe sequence and use of railling . Stair Management Technique: Two rails Number of  Stairs: 4          PT Goals Acute Rehab PT Goals Pt will go Sit to Stand: with upper extremity assist;with modified independence PT Goal: Sit to Stand - Progress: Progressing toward goal Pt will go Stand to Sit: with modified independence;with upper extremity assist PT Goal: Stand to Sit - Progress: Progressing toward goal Pt will Ambulate: >150 feet;with supervision;with least restrictive assistive device PT Goal: Ambulate - Progress: Progressing toward goal Pt will Go Up / Down Stairs: with supervision;with rail(s);3-5 stairs (updated, pt will be using hospital bed at home) PT Goal: Up/Down Stairs - Progress: Progressing toward goal Additional Goals Additional Goal #1: Pt will be able to describe and adhere to 3/3 back precuations throughout PT treatment. PT Goal: Additional Goal #1 - Progress: Progressing toward goal  Visit Information  Last PT Received On: 04/04/12 Assistance Needed: +1          Cognition  Overall Cognitive Status: Appears within functional limits for tasks assessed/performed Arousal/Alertness: Awake/alert Orientation Level: Oriented X4 / Intact Behavior During Session: [facility] for tasks performed         End of Session PT - End of Session Equipment Utilized During Treatment: Gait belt;Back brace Activity Tolerance: Patient tolerated treatment well Patient left: in bed;with call bell/phone within reach    Wilhemina Bonito 04/04/2012, 1:09 PM  Sherie Don) Carleene Mains PT, DPT Acute Rehabilitation [phone]

## 2012-04-05 LAB — GLUCOSE, CAPILLARY: Glucose-Capillary: 50 mg/dL — ABNORMAL LOW (ref 70–99)

## 2012-04-05 MED ORDER — DIAZEPAM 5 MG PO TABS: 5.0000 mg | ORAL_TABLET | Freq: Four times a day (QID) | ORAL | Status: AC | PRN

## 2012-04-05 MED ORDER — OXYCODONE-ACETAMINOPHEN 5-325 MG PO TABS: 1.0000 | ORAL_TABLET | ORAL | Status: AC | PRN

## 2012-04-05 NOTE — Progress Notes (Signed)
   CARE MANAGEMENT NOTE 04/05/2012  Patient:  SHEREA, LIPTAK   Account Number:  [id number]  Date Initiated:  04/03/2012  Documentation initiated by:  ROYAL,CHERYL  Subjective/Objective Assessment:   Order for [facility] and DME     Action/Plan:   No HH needs per PT/OT eval, pt has RW and request hospital bed. [facility] to eval for hospital bed and/or provide if pt agreeable to rental   Anticipated DC Date:  04/04/2012   Anticipated DC Plan:  HOME/SELF CARE      DC Planning Services  CM consult      Choice offered to / List presented to:     DME arranged  HOSPITAL BED  3-N-1  [facility] Hurst      DME agency  [facility]        Status of service:  Completed, signed off Medicare Important Message given?   (If response is "NO", the following Medicare IM given date fields will be blank) Date Medicare IM given:   Date Additional Medicare IM given:    Discharge Disposition:  HOME/SELF CARE  Per UR Regulation:    If discussed at Long Length of Stay Meetings, dates discussed:    Comments:  04/05/2012 1240 Contacted [facility] for DME for scheduled D/c today. HH PT/OT not recommended at this time. Isidoro Donning RN CCM Case Mgmt phone [phone]

## 2012-04-05 NOTE — Discharge Summary (Signed)
Physician Discharge Summary  Patient ID: Sheila Singleton MRN: [id number] DOB/AGE: [birth year] 67 y.o.  Admit date: 04/02/2012 Discharge date: 04/05/2012  Admission Diagnoses: L4-5 spondylolisthesis, stenosis, neurogenic claudication  Discharge Diagnoses: L4-5 spondylolisthesis, stenosis, neurogenic claudication Diabetes mellitus, history of deep venous thrombosis, placement of vena cava filter, chronic anticoagulation  Active Problems:  * No active hospital problems. *    Discharged Condition: fair  Hospital Course: Patient was admitted to undergo surgery after having a temporary vena cava filter placed so that anticoagulation could be stopped and the patient undergo her surgery safely. She has been on Lovenox anticoagulation during the postoperative period. She will restart her Coumadin at this time. Back and leg pain have improved substantially.  Consults: Interventional radiology  Significant Diagnostic Studies: MRI lumbar  Treatments: surgery: Posterior decompression L4-5 with posterior lumbar interbody arthrodesis using peek spacers pedicle screw fixation  Discharge Exam: Blood pressure 127/52, pulse 83, temperature 98.5 F (36.9 C), temperature source Oral, resp. rate 20, height 5\' 3"  (1.6 m), weight 111.131 kg (245 lb), SpO2 97.00%. Motor function good in both lower extremities mild edema at ankles 1+ incision clean and dry  Disposition: Discharge home  Discharge Orders    Future Orders Please Complete By Expires   Diet - low sodium heart healthy      Increase activity slowly      Discharge instructions      Comments:   Sit straight walk straight stand straight. Mind your posture.   Call MD for:  redness, tenderness, or signs of infection (pain, swelling, redness, odor or green/yellow discharge around incision site)      Call MD for:  severe uncontrolled pain      Call MD for:  temperature >100.4        Medication List  As of 04/05/2012  9:55 AM   TAKE these medications          acetaminophen 650 MG suppository   Commonly known as: TYLENOL   Place 1,300 mg rectally 2 (two) times daily.      cholecalciferol 1000 UNITS tablet   Commonly known as: VITAMIN D   Take 1,000 Units by mouth daily.      diazepam 5 MG tablet   Commonly known as: VALIUM   Take 1 tablet (5 mg total) by mouth every 6 (six) hours as needed (Muscle spasm).      DULoxetine 60 MG capsule   Commonly known as: CYMBALTA   Take 60 mg by mouth daily.      glipiZIDE 10 MG tablet   Commonly known as: GLUCOTROL   Take 20 mg by mouth daily.      levothyroxine 175 MCG tablet   Commonly known as: SYNTHROID, LEVOTHROID   Take 175 mcg by mouth daily.      MULTIVITAMIN GUMMIES ADULT Chew   Chew 2 tablets by mouth daily.      beta carotene w/minerals tablet   Take 1 tablet by mouth daily.      omeprazole 20 MG capsule   Commonly known as: PRILOSEC   Take 20 mg by mouth daily.      oxyCODONE-acetaminophen 5-325 MG per tablet   Commonly known as: PERCOCET   Take 1-2 tablets by mouth every 4 (four) hours as needed for pain.      pioglitazone 30 MG tablet   Commonly known as: ACTOS   Take 30 mg by mouth daily.      warfarin 3 MG tablet   Commonly known as:  COUMADIN   Take 3 mg by mouth See admin instructions. Mon, tues, wednes, thurs, fri      warfarin 4 MG tablet   Commonly known as: COUMADIN   Take 4 mg by mouth See admin instructions. On Saturday and sunday             Signed: Stefani Dama 04/05/2012, 9:55 AM

## 2012-04-06 MED FILL — Heparin Sodium (Porcine) Inj 1000 Unit/ML: INTRAMUSCULAR | Qty: 30 | Status: AC

## 2012-04-06 MED FILL — Sodium Chloride IV Soln 0.9%: INTRAVENOUS | Qty: 1000 | Status: AC

## 2012-05-01 IMAGING — CR DG CHEST 2V
2 series · 2 of 2 positions shown · non-contrast
Comparison: None.

CLINICAL DATA: Preop for spondylolisthesis lumbar spine

CHEST - 2 VIEW

[view not recorded (1 of 2)]
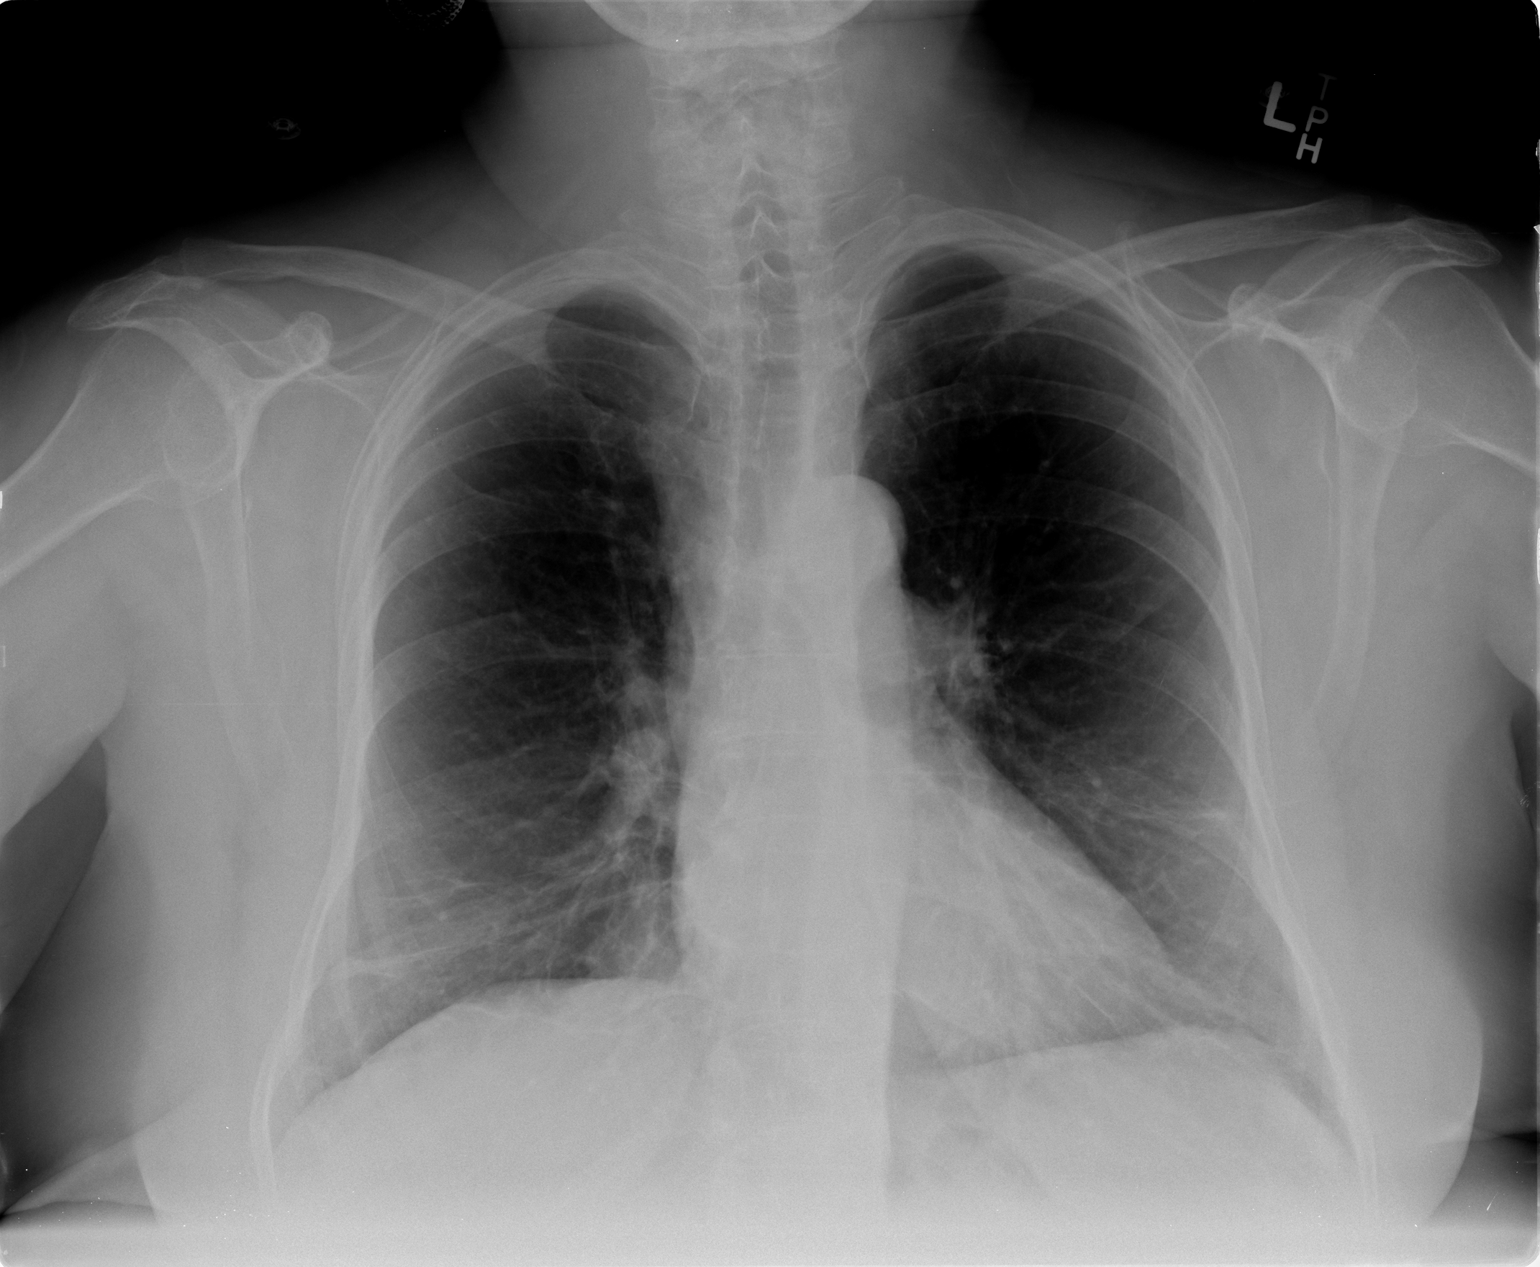

[view not recorded (2 of 2)]
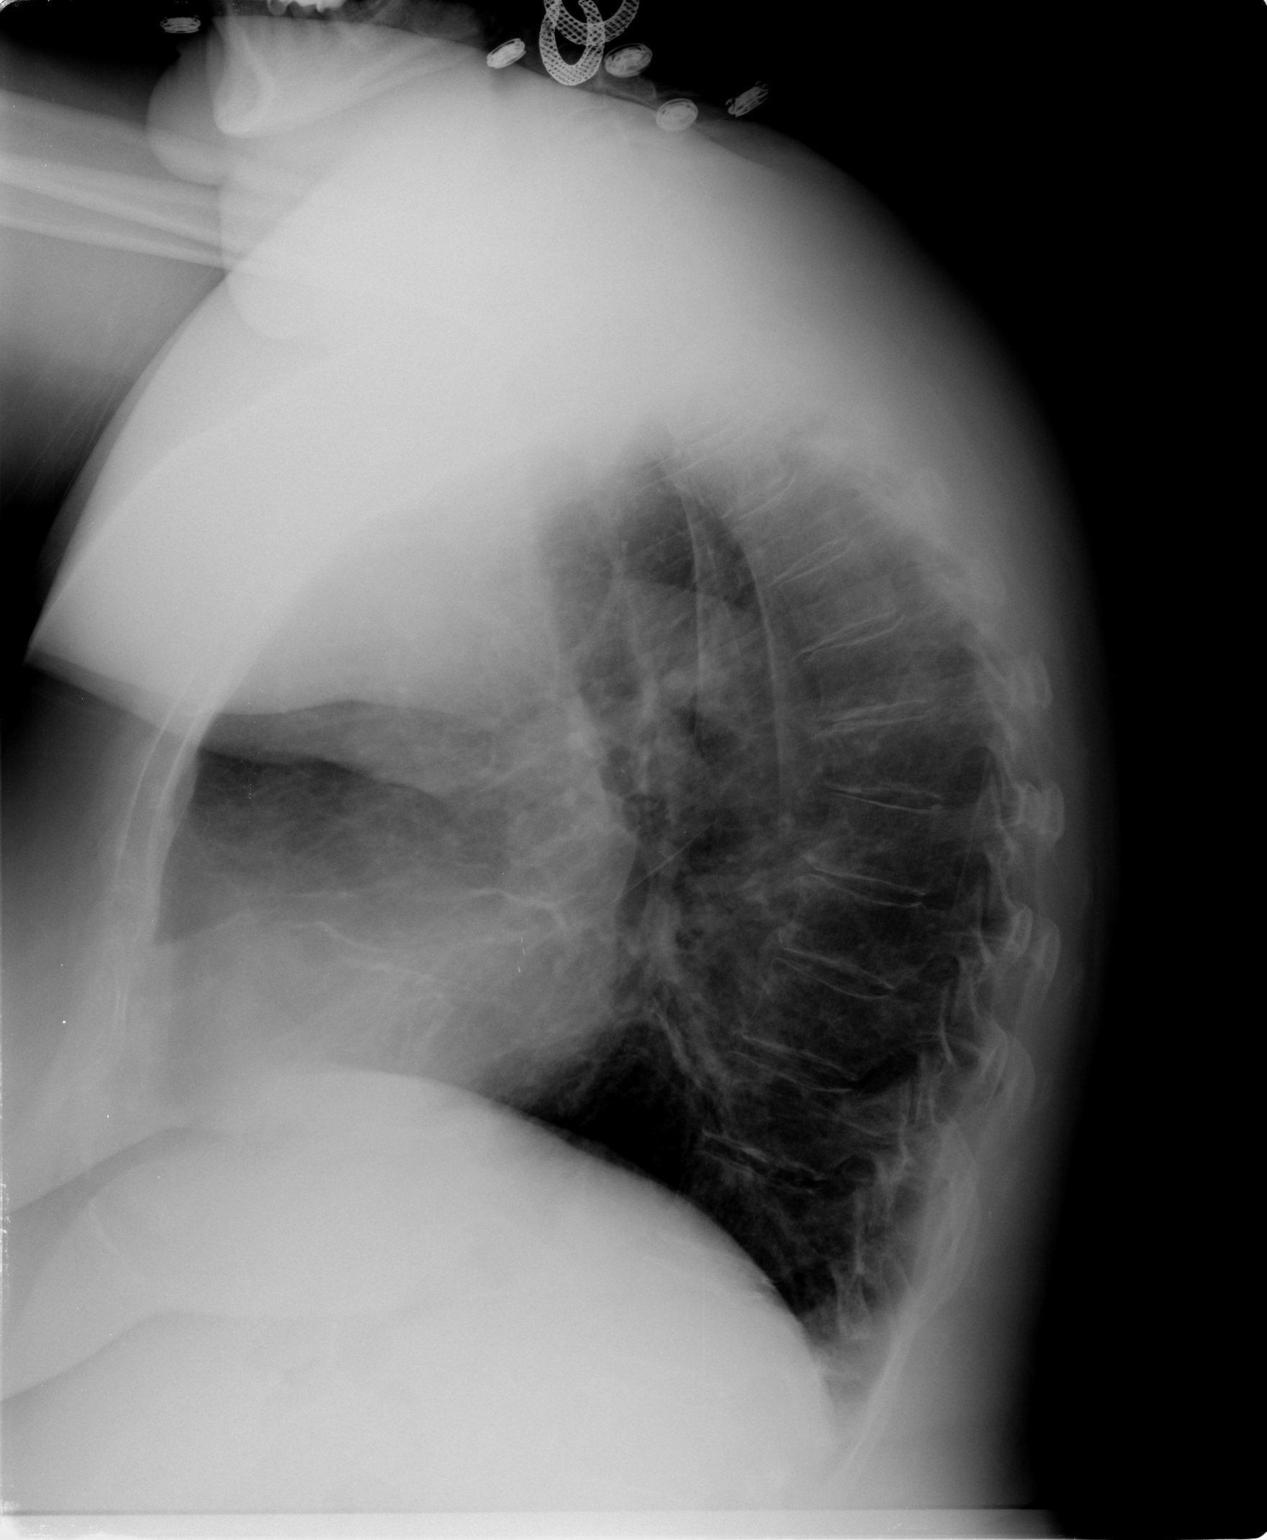

[2 of 2 positions shown; findings below may reference images not displayed]

FINDINGS: Cardiomediastinal silhouette is unremarkable.  No acute
infiltrate or pleural effusion.  No pulmonary edema.  Mild
degenerative changes mid thoracic spine.  Linear atelectasis or
scarring noted in the lingula and right base.
IMPRESSION: No acute infiltrate or pulmonary edema.  Linear atelectasis or
scarring noted right base and lingula.

## 2012-05-02 IMAGING — US IR IVC FILTER PLMT / S&I /IMG GUID/MOD SED
1 series · 1 of 1 positions shown · non-contrast
Comparison: None

INDICATION: Known DVT with contraindication to anticoagulation
(impending lumbar spine surgery)

ULTRASOUND GUIDANCE FOR VASCULAR ACCESS
IVC CATHETERIZATION AND VENOGRAM
IVC FILTER INSERTION

[Series 1: ir ivc filter plmt / s&i /img guid/mod sed · 1 of 1 slices shown]
[im 1/1]
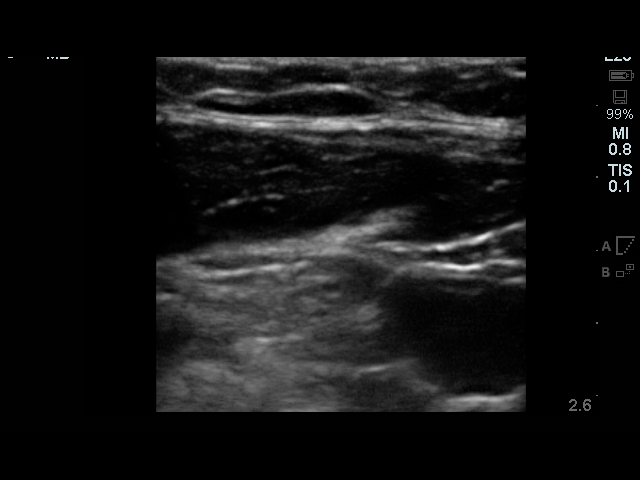

[1 of 1 positions shown; findings below may reference images not displayed]

Medications: Fentanyl 50 mcg IV; Versed 1 mg IV

Contrast: 45 mL Kmnipaque-IGG

Sedation time: 20 minutes

Fluoroscopy time: 2.4 minutes

Complications: None immediate

PROCEDURE:

Informed consent was obtained from the patient following
explanation of the procedure, risks, benefits and alternatives.
The patient understands, agrees and consents for the procedure.
All questions were addressed.  A time out was performed prior to
the initiation of the procedure.

Maximal barrier sterile technique utilized including caps, mask,
sterile gowns, sterile gloves, large sterile drape, hand hygiene,
and Betadine prep.

Under sterile condition and local anesthesia, right internal
jugular venous access was performed with ultrasound.  An ultrasound
image was saved and sent to PACS.  Over a guide wire, the IVC
filter delivery sheath and inner dilator were advanced into the IVC
just above the IVC bifurcation.  Contrast injection was performed
for an IVC venogram.

Through the delivery sheath, a retrievable Celect IVC filter was
deployed below the level of the renal veins and above the IVC
bifurcation.  Several spot radiographs were obtained in multiple
obliquities.

The delivery sheath was removed and hemostasis was obtained with
manual compression.  A dressing was placed.  The patient tolerated
the procedure well without immediate post procedural complication.
FINDINGS: The IVC is patent without evidence of thrombus, stenosis, or
occlusion.  No variant venous anatomy.   Successful placement of
the IVC filter below the level of the renal veins.
IMPRESSION: Successful ultrasound and fluoroscopic guided placement of an
infrarenal retrievable IVC filter.

## 2012-05-20 ENCOUNTER — Ambulatory Visit: Payer: Self-pay | Admitting: Neurological Surgery

## 2012-05-28 ENCOUNTER — Encounter: Payer: Self-pay | Admitting: Neurological Surgery

## 2012-06-01 ENCOUNTER — Other Ambulatory Visit: Payer: Self-pay | Admitting: Radiology

## 2012-06-01 ENCOUNTER — Other Ambulatory Visit (HOSPITAL_COMMUNITY): Payer: Self-pay | Admitting: Neurological Surgery

## 2012-06-01 DIAGNOSIS — I82409 Acute embolism and thrombosis of unspecified deep veins of unspecified lower extremity: Secondary | ICD-10-CM

## 2012-06-03 ENCOUNTER — Ambulatory Visit (HOSPITAL_COMMUNITY)
Admission: RE | Admit: 2012-06-03 | Discharge: 2012-06-03 | Payer: Medicare Other | Source: Ambulatory Visit | Attending: Neurological Surgery | Admitting: Neurological Surgery

## 2012-06-04 ENCOUNTER — Telehealth (HOSPITAL_COMMUNITY): Payer: Self-pay

## 2012-06-04 NOTE — Telephone Encounter (Signed)
I spoke with pt. She stated that she has Physical Therapy 2 times a week.  She would have to go look at her calendar down stairs to see when she has a 3 day break.  She also stated that she had to carry her mother to the doctor.  She would call back later today

## 2012-06-08 ENCOUNTER — Encounter: Payer: Self-pay | Admitting: Neurological Surgery

## 2012-07-01 ENCOUNTER — Ambulatory Visit: Payer: Self-pay | Admitting: Family Medicine

## 2012-07-09 ENCOUNTER — Encounter: Payer: Self-pay | Admitting: Neurological Surgery

## 2012-08-06 IMAGING — US US EXTREM LOW VENOUS BILAT
1 series · 14 of 24 positions shown · non-contrast
Comparison: none

REASON FOR EXAM: STAT CR [id number] After 5 call Dr Gamez at [REDACTED] Bilateral leg s...
COMMENTS:

[Series 1: us extrem low venous bilat · 0.11mm/px · 14 of 62 slices shown]
[im 1/62]
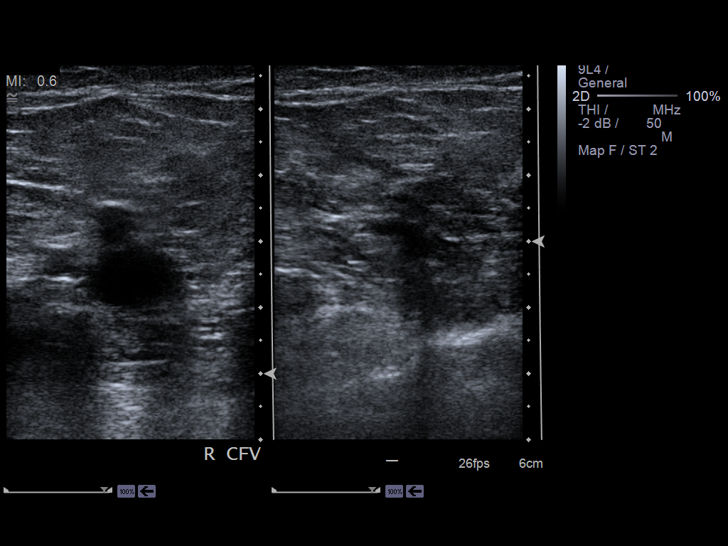
[im 6/62]
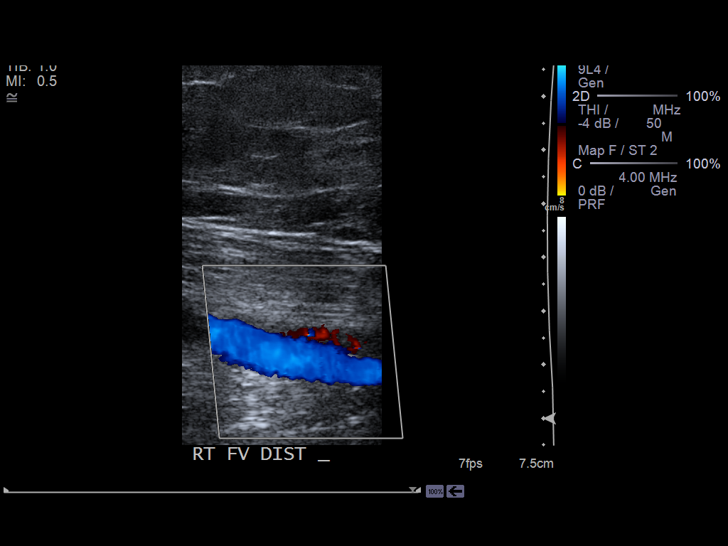
[im 11/62]
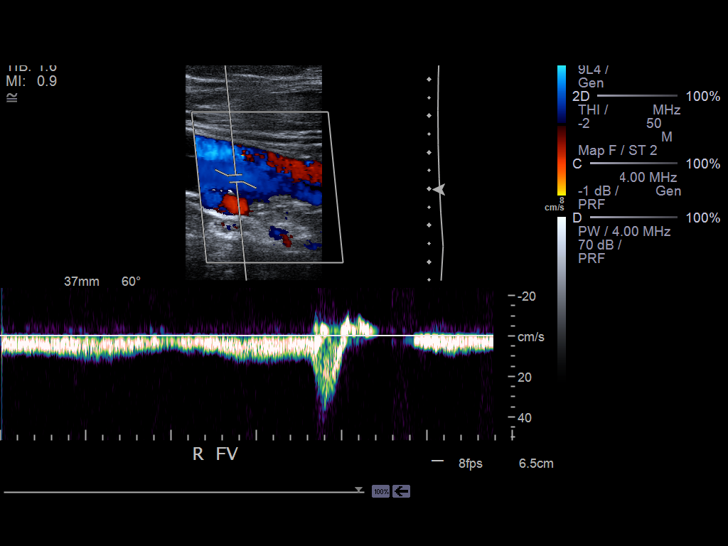
[im 16/62]
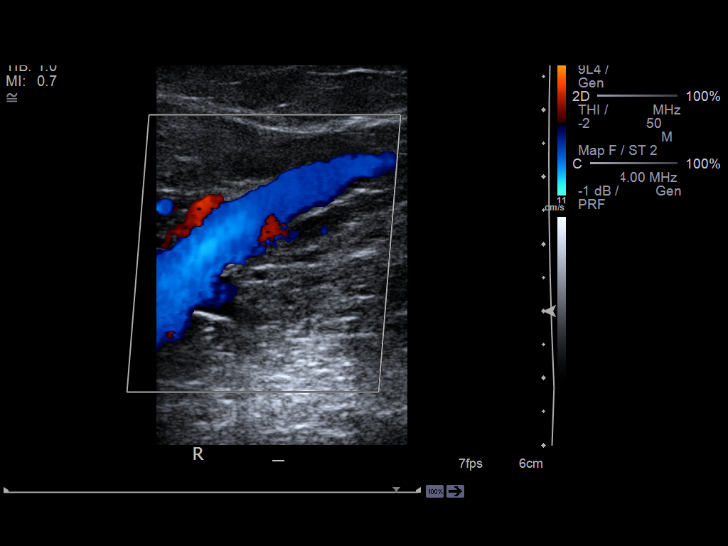
[im 19/62]
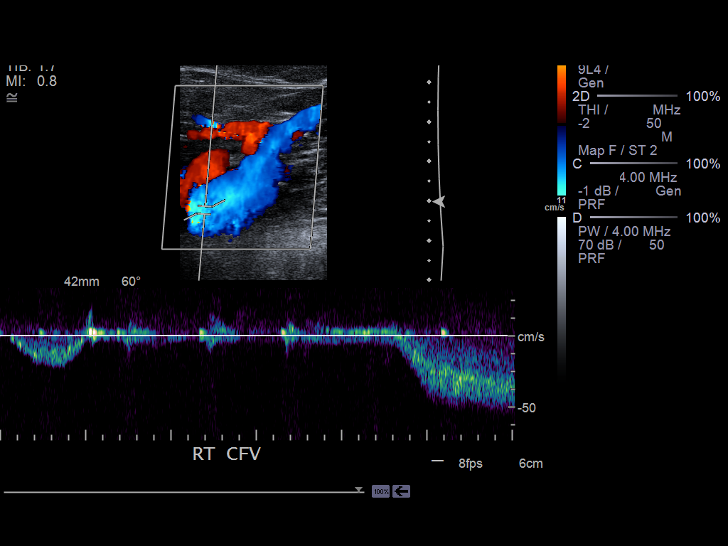
[im 24/62]
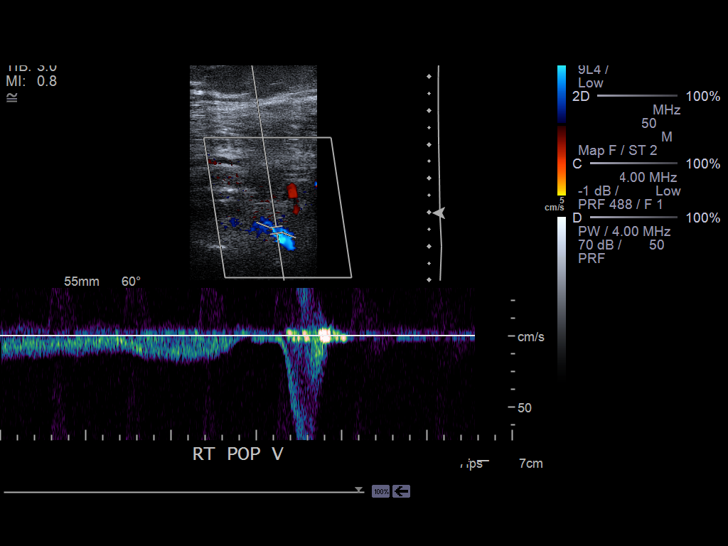
[im 30/62]
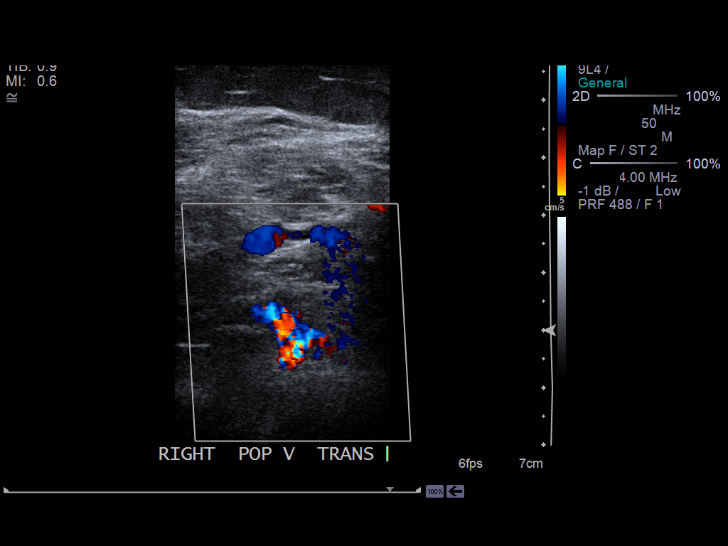
[im 32/62]
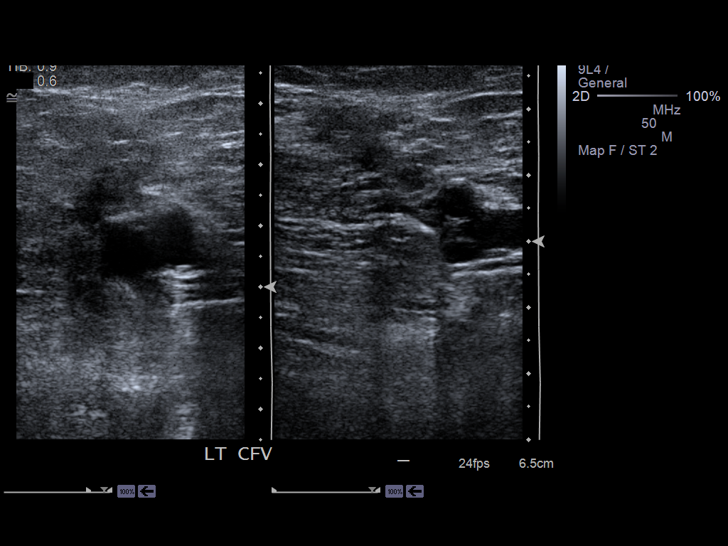
[im 38/62]
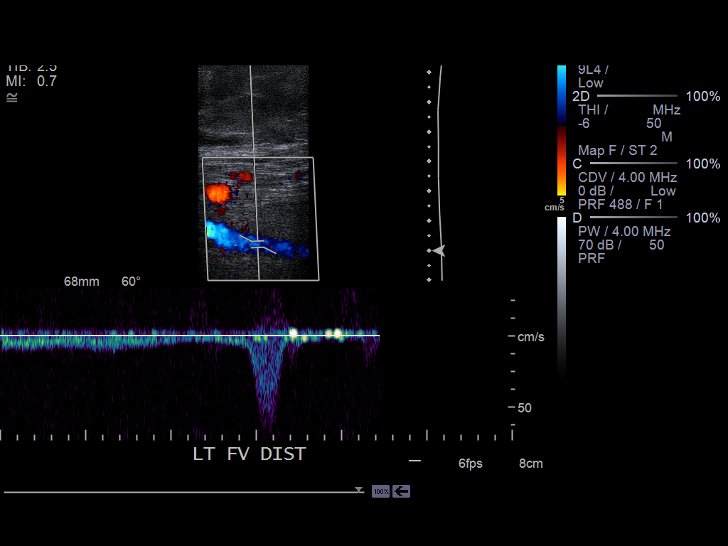
[im 43/62]
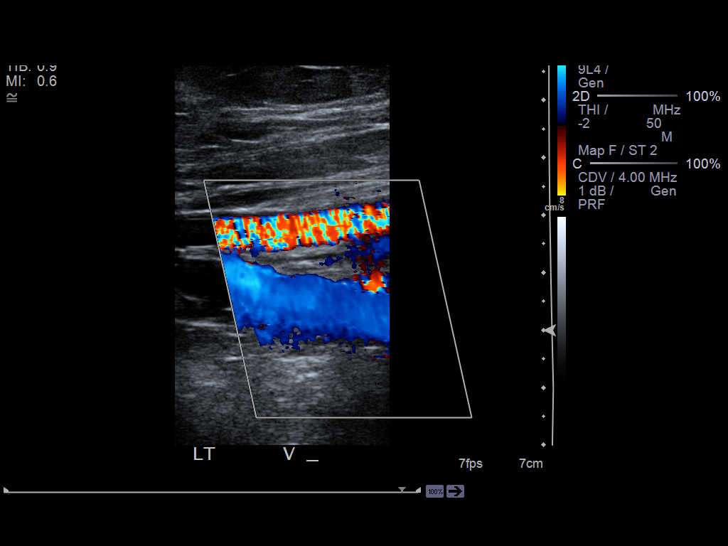
[im 48/62]
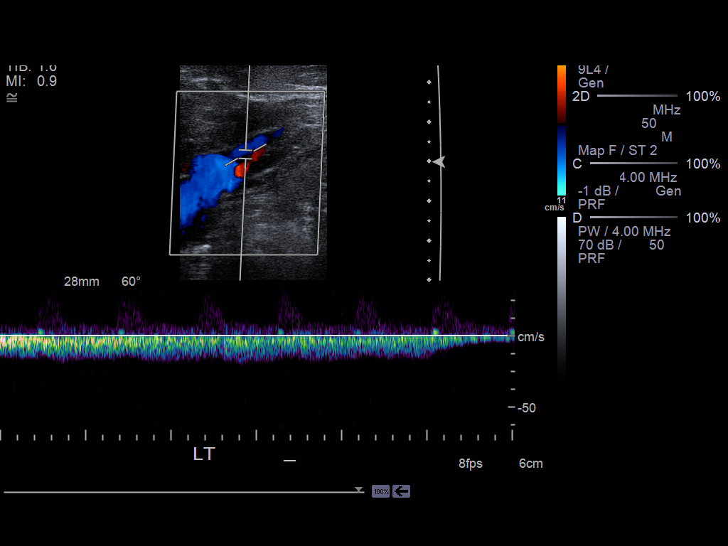
[im 51/62]
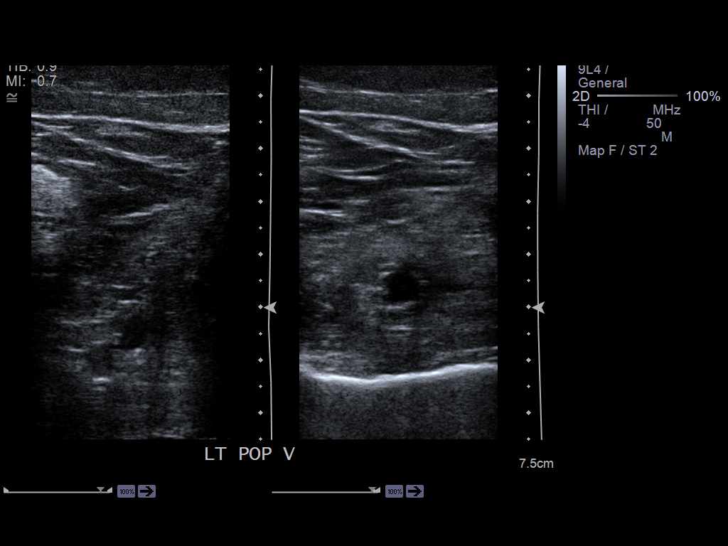
[im 56/62]
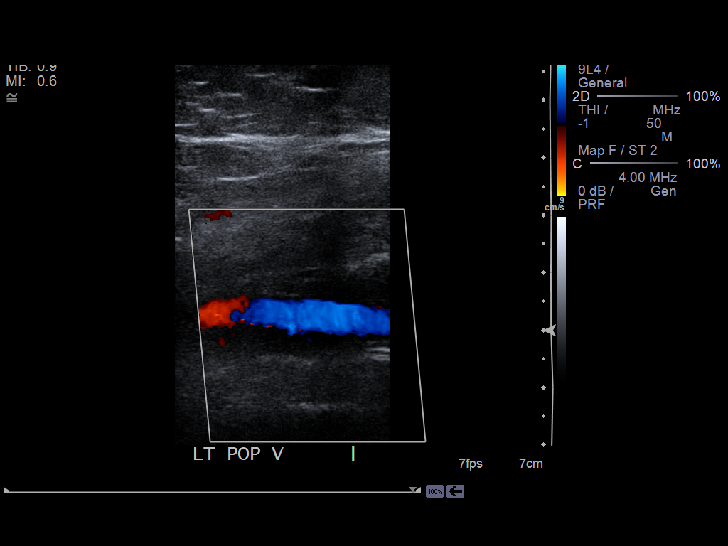
[im 62/62]
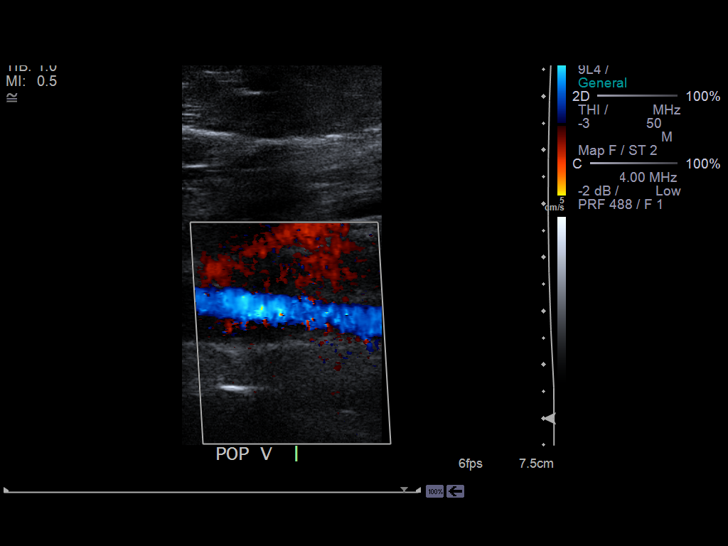

[14 of 24 positions shown; findings below may reference images not displayed]

PROCEDURE:     US  - US DOPPLER LOW EXTR BILATERAL  - July 01, 2012  [DATE]

RESULT:     Duplex Doppler interrogation of the deep venous system of both
legs from the inguinal to the popliteal region demonstrates the deep venous
systems are fully compressible throughout. The color Doppler and spectral
Doppler appearance is normal. There is normal response to distal
augmentation. The color Doppler images show no filling defect.

Enlarged lymph nodes are seen bilaterally in the inguinal regions. These
could be reactive. Followup is recommended clinically.
IMPRESSION: 1. No evidence of DVT in either lower extremity.
2. Bilateral inguinal adenopathy.

[REDACTED]

## 2012-08-09 ENCOUNTER — Encounter: Payer: Self-pay | Admitting: Neurological Surgery

## 2012-08-19 ENCOUNTER — Ambulatory Visit: Payer: Self-pay | Admitting: Orthopedic Surgery

## 2012-08-25 ENCOUNTER — Ambulatory Visit: Payer: Self-pay | Admitting: Internal Medicine

## 2012-09-08 ENCOUNTER — Encounter: Payer: Self-pay | Admitting: Neurological Surgery

## 2012-09-24 IMAGING — CR DG LUMBAR SPINE 2-3V
1 series · 3 of 3 positions shown · non-contrast
Comparison: none

REASON FOR EXAM: low back pain left hip pain
COMMENTS:

[Series 1: ap · 0.17mm/px · 3 of 3 slices shown]
[im 1/3]
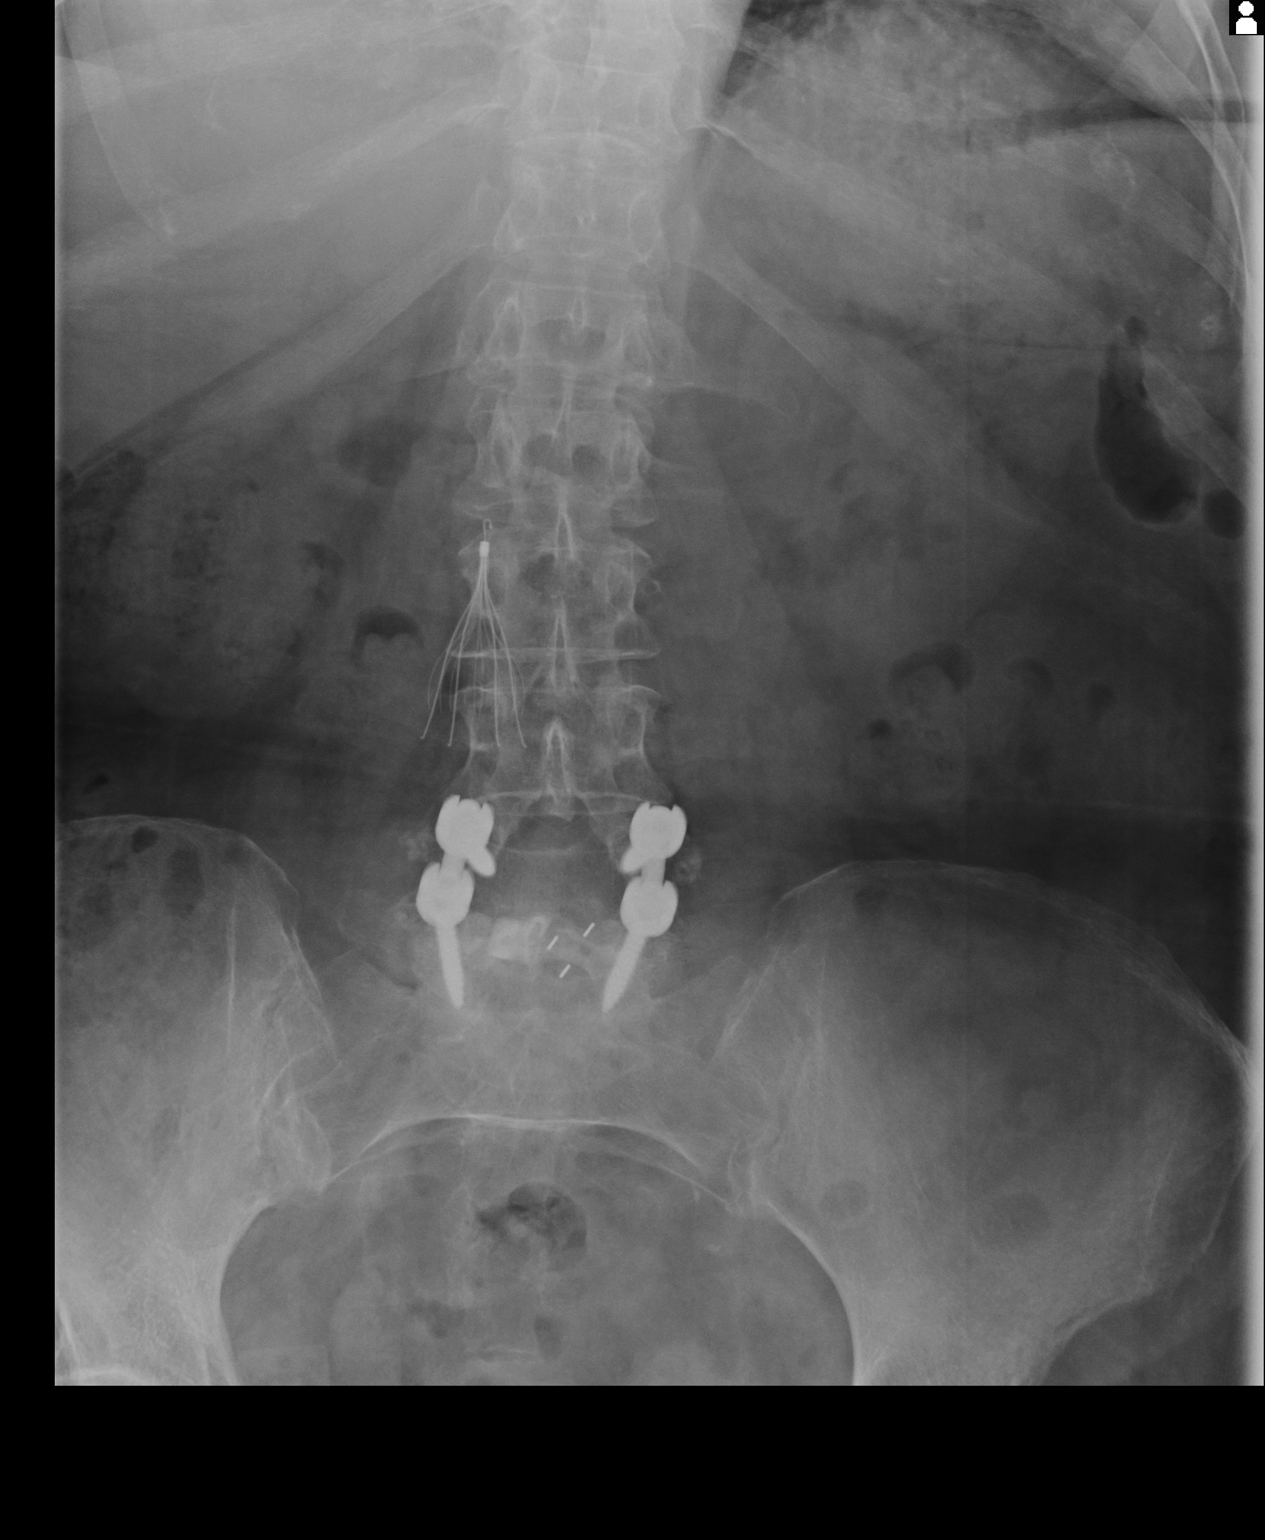
[im 2/3]
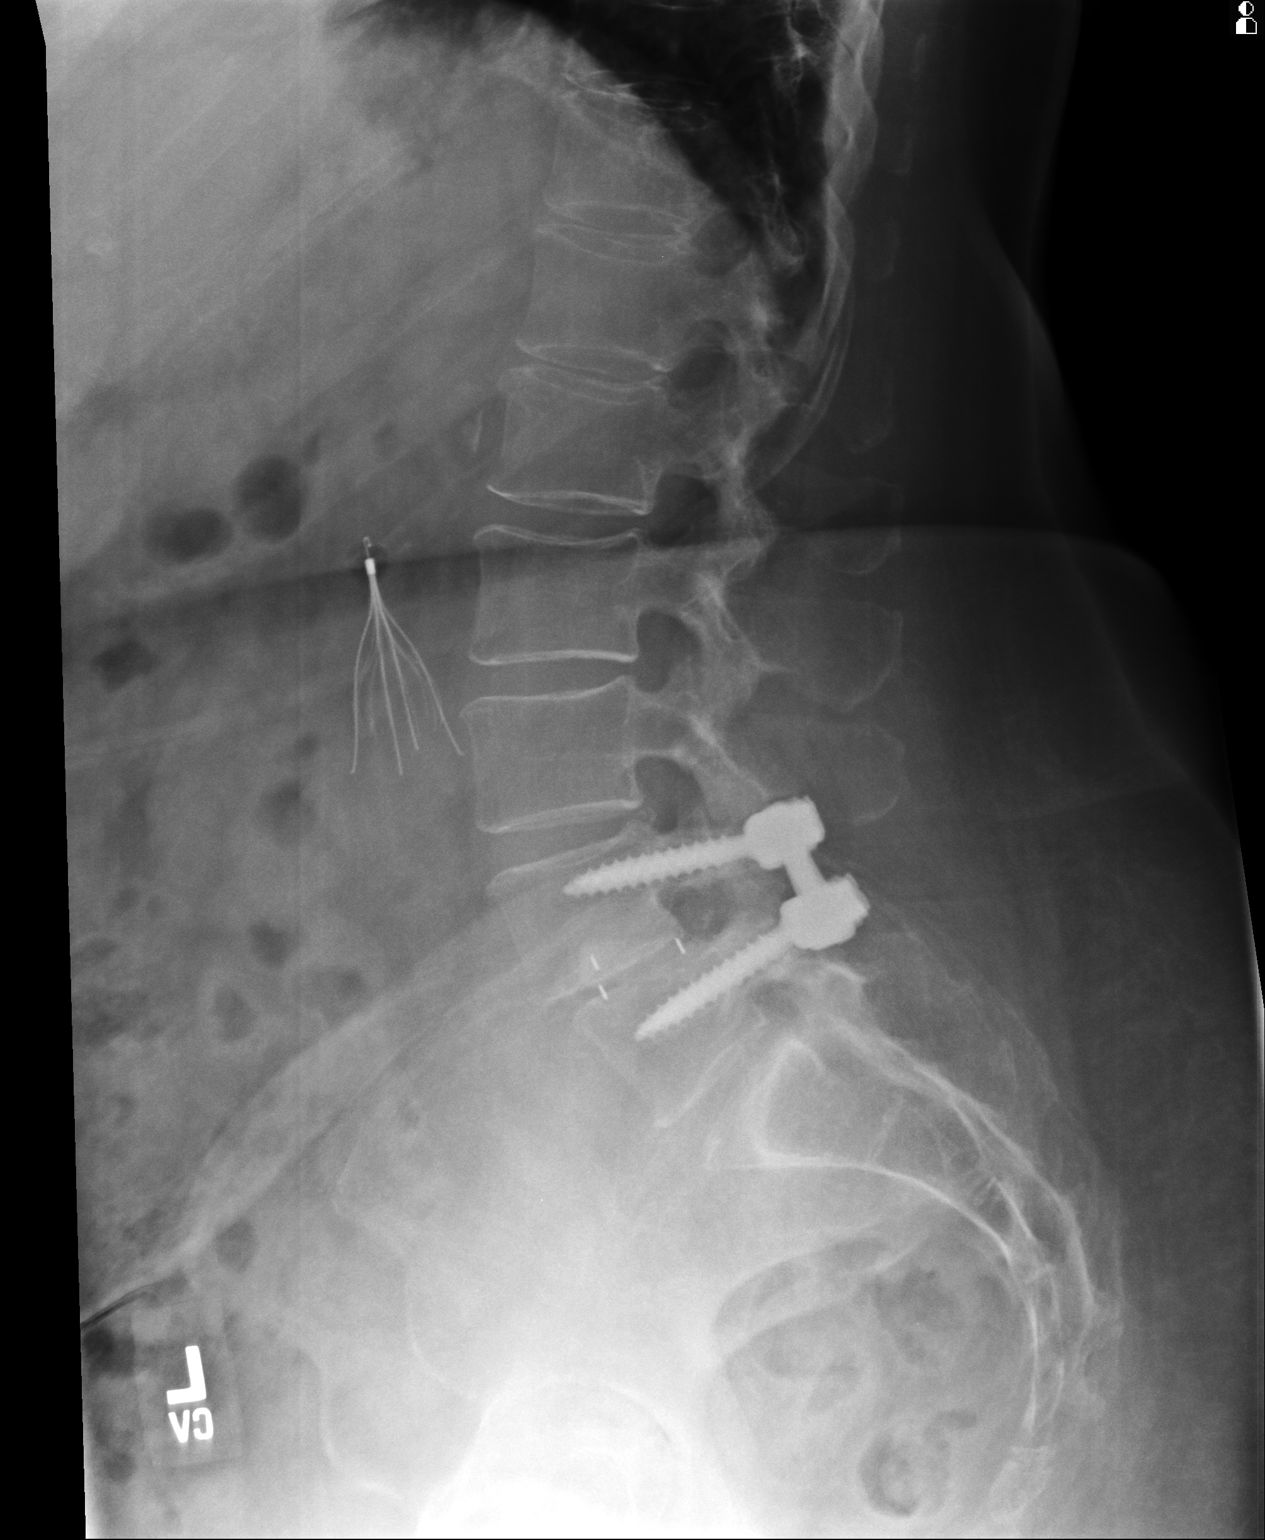
[im 3/3]
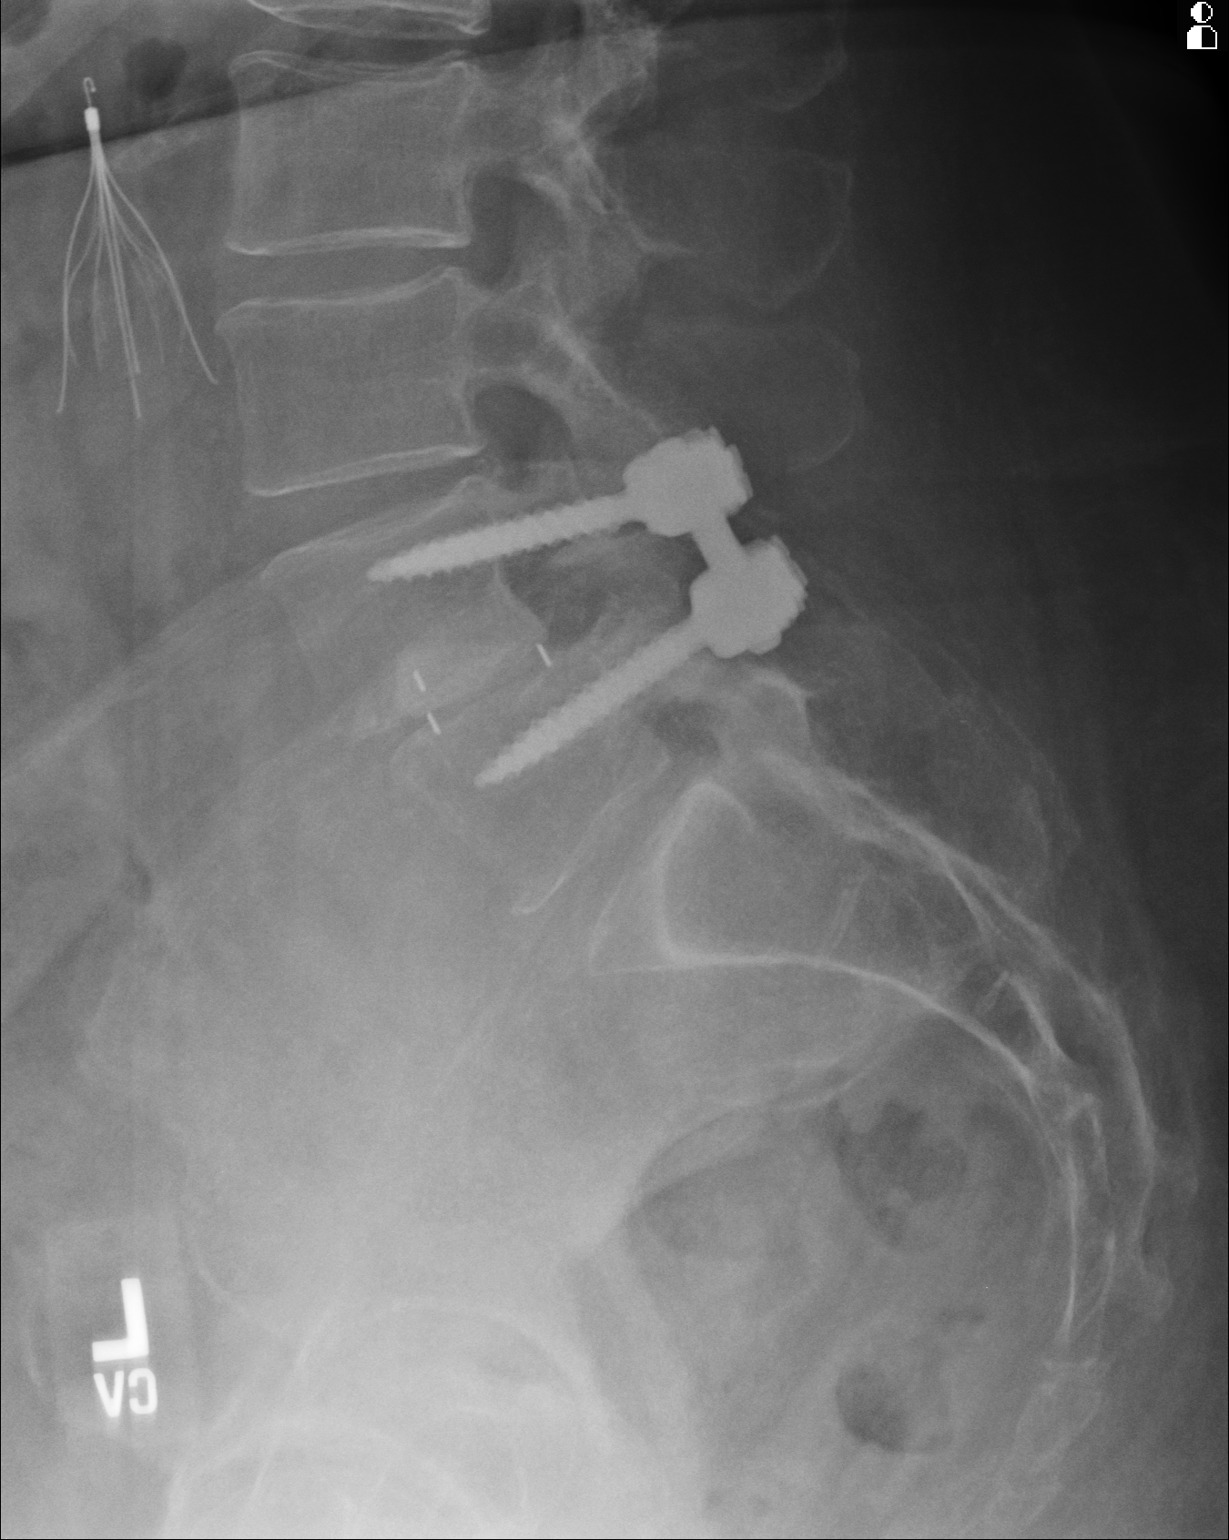

[3 of 3 positions shown; findings below may reference images not displayed]

PROCEDURE:     KDR - KDXR LUMBAR SPINE AP AND LATERAL  - August 19, 2012  [DATE]

RESULT:     Degenerative changes lumbar spine. Patient has had prior lower
lumbar fusion. Inferior vena caval filter noted in good position. No acute
bony abnormality identified. There is grade 1 L4-L5 spondylolisthesis which
is stable from 01/11/2012.
IMPRESSION: 1. No acute abnormality.
2. Grade 1 stable L4-L5 spondylo- listhesis. Prior fusion at this level.
3. Inferior vena caval filter in good position.

## 2012-09-24 IMAGING — CR DG HIP 1V*L*
1 series · 1 of 1 positions shown · non-contrast
Comparison: none

REASON FOR EXAM: low back pain left hip pain
COMMENTS:

PROCEDURE:     KDR - KDXR HIP LEFT ONE VIEW  - August 19, 2012  [DATE]
RESULT:     Degenerative changes noted about the left hip. Flattening of the
femoral head is noted. Avascular necrosis cannot be excluded. There is no
evidence of fracture-dislocation.

[lat]
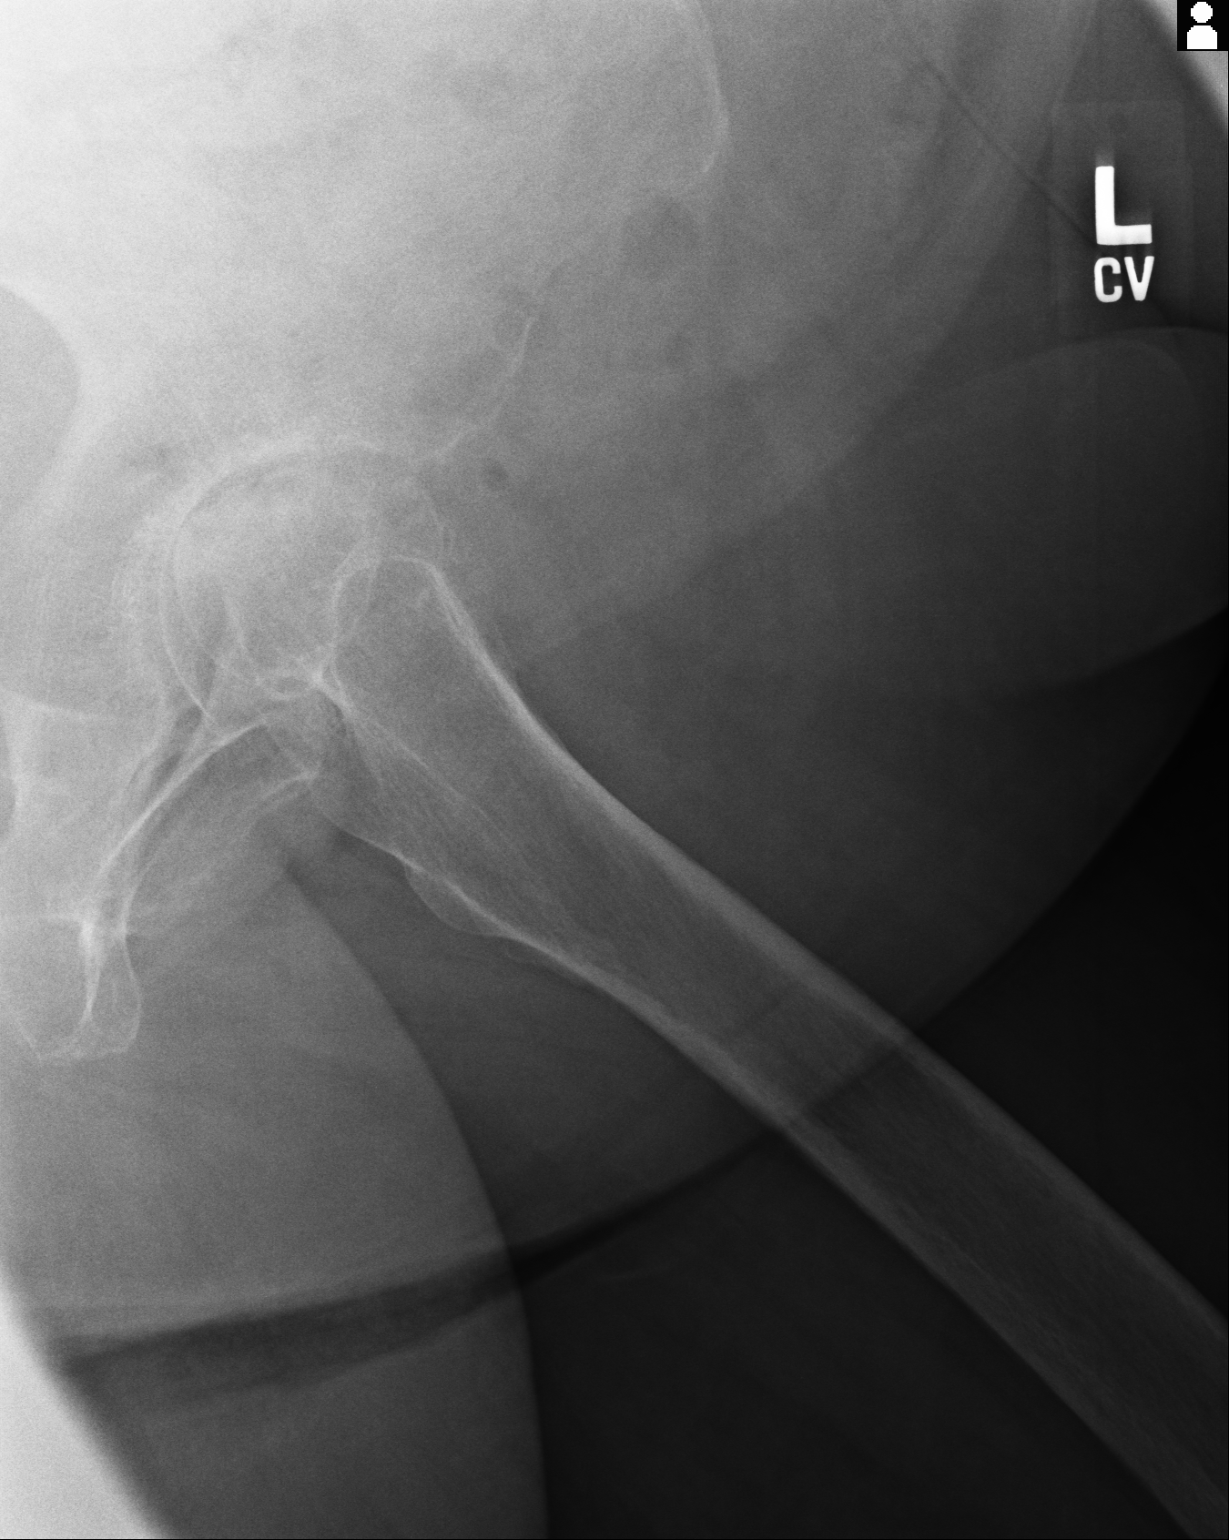

[1 of 1 positions shown; findings below may reference images not displayed]

IMPRESSION: Degenerative changes left hip. Avascular necrosis of the
left femoral head cannot be excluded.

## 2012-10-09 ENCOUNTER — Encounter: Payer: Self-pay | Admitting: Neurological Surgery

## 2012-10-21 ENCOUNTER — Ambulatory Visit: Payer: Self-pay | Admitting: Orthopedic Surgery

## 2012-10-21 LAB — PROTIME-INR: Prothrombin Time: 13.7 secs (ref 11.5–14.7)

## 2012-10-21 LAB — CBC
HCT: 34.1 % — ABNORMAL LOW (ref 35.0–47.0)
HGB: 11.7 g/dL — ABNORMAL LOW (ref 12.0–16.0)
MCHC: 34.2 g/dL (ref 32.0–36.0)
RBC: 3.67 10*6/uL — ABNORMAL LOW (ref 3.80–5.20)

## 2012-10-21 LAB — BASIC METABOLIC PANEL
Anion Gap: 7 (ref 7–16)
BUN: 17 mg/dL (ref 7–18)
Chloride: 109 mmol/L — ABNORMAL HIGH (ref 98–107)
Co2: 25 mmol/L (ref 21–32)
Creatinine: 0.77 mg/dL (ref 0.60–1.30)
Potassium: 4 mmol/L (ref 3.5–5.1)
Sodium: 141 mmol/L (ref 136–145)

## 2012-10-21 LAB — SEDIMENTATION RATE: Erythrocyte Sed Rate: 22 mm/hr (ref 0–30)

## 2012-10-27 ENCOUNTER — Inpatient Hospital Stay: Payer: Self-pay | Admitting: Nurse Practitioner

## 2012-10-28 LAB — BASIC METABOLIC PANEL
Anion Gap: 8 (ref 7–16)
BUN: 18 mg/dL (ref 7–18)
EGFR (Non-African Amer.): 42 — ABNORMAL LOW
Glucose: 170 mg/dL — ABNORMAL HIGH (ref 65–99)
Osmolality: 280 (ref 275–301)

## 2012-10-28 LAB — HEMOGLOBIN: HGB: 10.2 g/dL — ABNORMAL LOW (ref 12.0–16.0)

## 2012-10-29 LAB — PATHOLOGY REPORT

## 2012-10-29 LAB — BASIC METABOLIC PANEL
Anion Gap: 6 — ABNORMAL LOW (ref 7–16)
Creatinine: 0.95 mg/dL (ref 0.60–1.30)
EGFR (African American): >60
EGFR (Non-African Amer.): >60
Glucose: 140 mg/dL — ABNORMAL HIGH (ref 65–99)
Sodium: 137 mmol/L (ref 136–145)

## 2012-10-30 ENCOUNTER — Encounter: Payer: Self-pay | Admitting: Internal Medicine

## 2012-11-08 ENCOUNTER — Encounter: Payer: Self-pay | Admitting: Internal Medicine

## 2012-11-09 LAB — URINALYSIS, COMPLETE
Bilirubin,UR: NEGATIVE
Blood: NEGATIVE
Glucose,UR: NEGATIVE mg/dL (ref 0–75)
Leukocyte Esterase: NEGATIVE
RBC,UR: NONE SEEN /HPF (ref 0–5)
Specific Gravity: 1.009 (ref 1.003–1.030)
Squamous Epithelial: <1
WBC UR: <1 /HPF (ref 0–5)

## 2013-01-27 ENCOUNTER — Encounter: Payer: Self-pay | Admitting: Orthopedic Surgery

## 2013-02-06 ENCOUNTER — Encounter: Payer: Self-pay | Admitting: Orthopedic Surgery

## 2013-02-24 ENCOUNTER — Ambulatory Visit: Payer: Self-pay | Admitting: Internal Medicine

## 2013-03-09 ENCOUNTER — Encounter: Payer: Self-pay | Admitting: Orthopedic Surgery

## 2013-04-08 ENCOUNTER — Encounter: Payer: Self-pay | Admitting: Orthopedic Surgery

## 2014-04-06 ENCOUNTER — Ambulatory Visit: Payer: Self-pay | Admitting: Family Medicine

## 2015-03-28 NOTE — Discharge Summary (Signed)
PATIENT NAME:  Sheila Singleton, Sheila Singleton MR#:  [id number] DATE OF BIRTH:  04-05-1946  DATE OF ADMISSION:  10/27/2012 DATE OF DISCHARGE:  10/31/2012  ADMISSION DIAGNOSIS: Left hip osteoarthritis.   DISCHARGE DIAGNOSES: Left hip osteoarthritis.   OPERATION: On 10/27/2012, she had a left total hip arthroplasty.   Surgeon was Dr. Kennedy Bucker.   Anesthesia was spinal.   EBL was 350 mL.   Fluids replaced were 125 mL Cell Saver.   Drains wound VAC.   Complications: Greater trochanter fracture.   Implants used: Medacta AIMS 7 stem, 48 cup and DM liner, medium head.   The patient was stabilized and brought to the recovery room  and then brought to the Orthopedic floor where she was treated with physical therapy and for pain control.   HISTORY: Sheila Singleton is a 69 year old female who had significant chronic back problems. She is also complaining of left hip pain. 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 She had a lumbar spine decompression and fusion on 04/02/2012 with Dr. Yetta Barre at [facility] with some relief but continued to have significant pain. She consented for surgery with Dr. Rosita Kea.   PHYSICAL EXAMINATION: LUNGS: Clear to auscultation. HEART: Regular rate and rhythm. HEENT: Normal. MUSCULOSKELETAL: Using crutches with 0 degree of internal rotation and 20 degrees external rotation. NEUROLOGICAL:   Has diminished sensation to both lower extremities in a stocking glove distribution secondary to neuropathy.    HOSPITAL COURSE: Sheila Singleton underwent left total hip arthroplasty on 10/27/2012. On postoperative day one, her hemoglobin was 10.2 and platelets were 222. She had a slight increase in her creatinine to 1.32 and BUN was 18. Her pain was controlled with a PCA, and she did not tolerate physical therapy that day. She complained of pain in her left ankle. On a postoperative day two, she again did not tolerate physical  therapy. She continued to complain of pain in her left ankle. Hemoglobin was 8.8. Her wound VAC was intact and with minimal drainage. She had a lower extremity Doppler which was negative for deep vein thrombosis, and an x-ray later that day confirmed an oblique  left distal fibular ankle fracture. On postoperative day three, she continued to complain of significant pain, especially with rolling over in bed. She has not been tolerating physical therapy well. A prescription was given for a walking boot, and she may be weightbearing as tolerated on the left lower extremity given the left ankle fracture. Her creatinine returned to baseline on postoperative day two to 0.95, and her BUN was 17. She is a stable and ready to go to rehab on 10/31/2012.   CONDITION AT DISCHARGE: Stable.   DISPOSITION: The patient was sent to rehab.   DISCHARGE INSTRUCTIONS:  1. The patient will follow up at [facility] in two weeks for staple removal.  2. She will do physical therapy and may be weightbearing as tolerated on the left lower extremity. Her diet is regular.  3. She should use TED hose bilaterally knee-high.  4. Her dressing can be changed daily and as needed.   DISCHARGE MEDICATIONS INCLUDE:  1. Cymbalta 60 mg, 1 cap daily.  2. Actos 30  mg, 1 tab p.o., daily.  3. Glipizide 10 mg extended release, 2 tabs p.o. daily.  4. Levothyroxine 175 mcg, 1 tab p.o. once daily.  5. Vitamin D 1000 international units, 1 cap p.o. daily.  6. Oxybutynin 5 mg/24-hour tablet, extended release, 1  tab daily.  7. Omeprazole 20 mg, 1 cap delayed release daily.   ADDITIONAL MEDICATIONS INCLUDE:  1. Xarelto 10 mg, 1 tab p.o. daily. 2. MS Contin 5 mg, 1 tab p.o. every 12 hours p.r.n. pain. 3. Tylenol 650 to 1000 mg every 6 hours as needed for pain.  4. Oxycodone 5 to 10 mg every 4 hours as needed for pain.   ____________________________ Sheila Singleton M. Sheila KhanBerndt, NP amb:cbb D: 10/30/2012 13:08:09 ET T: 10/30/2012  13:31:23 ET JOB#: [id number]  cc: Sheila Singleton M. Sheila KhanBerndt, NP, <Dictator> Sheila EkAPRIL M Deaire Mcwhirter FNP ELECTRONICALLY SIGNED 11/03/2012 15:46

## 2015-03-28 NOTE — Op Note (Signed)
    Leitha SchullerMICHAEL J Dominique Calvey MD ELECTRONICALLY SIGNED 10/28/2012 17:02

## 2015-03-28 NOTE — Op Note (Signed)
PATIENT NAME:  Sheila Singleton, Sheila Singleton MR#:  [id number] DATE OF BIRTH:  [birth year]  DATE OF PROCEDURE:  10/27/2012  PREOPERATIVE DIAGNOSIS: Left hip osteoarthritis, severe.   POSTOPERATIVE DIAGNOSIS: Left hip osteoarthritis, severe.   PROCEDURE: Left total hip replacement, anterior approach.   SURGEON: Leitha SchullerMichael J. Kristina Bertone, MD   ASSISTANT: Devota PaceApril Berndt, nurse practitioner    ANESTHESIA: Spinal.   DESCRIPTION OF PROCEDURE: The patient was brought to the operating room and after adequate anesthesia was obtained the patient was placed on the operative table with the Medacta attachment. C-arm was brought in and view of the right hip was taken initially as this was normal for subsequent comparison. The right foot was in the traction boot. The hip was prepped and draped in the usual sterile fashion. After appropriate patient identification and time-out procedures were completed, anterior incision was made over the tensor fascia muscle. After incision down through the skin and subcutaneous tissue, the tensor fascia was incised and the muscle retracted laterally. The deep fascia was incised and the rectus fascia identified, incised, and the muscle retracted medially. The anterior capsule was then opened with a flap and a modified Charnley retractor placed. The femoral neck cut was carried out and the femoral head was removed. It was noted to be flattened with no articular cartilage remaining. The acetabulum was noted to have extensive degenerative change and had labrum and scar tissue removed. Reaming was carried out to 48 mm and a 48 mm trial cup fit very well. The 48 mm cup was then impacted and appeared to be in good position. The leg was externally rotated and the posterior capsule partially released. As the leg was extended and externally rotated, the proximal femur was visualized. Box osteotome was used followed by starter rasp. Sequential rasping was carried out. Unfortunately, the greater trochanter did suffer  fracture at this point. Subsequent broaching took a size 7 to get a tight fit within the canal and subsequently the 7 stem was inserted. With trials taken off of this, the medium neck was chosen and the bipolar head attached. Intraoperative x-rays were taken during the procedure and it appeared that most of the leg length was restored with the components in this position. The hip appeared stable and the wound thoroughly irrigated. Hemostasis was achieved during the procedure with electrocautery. Cell saver was utilized as well. The case was made more difficult secondary to the patient's large size but stable components did appear at the close of the case. After thoroughly irrigating the wound, a #5 Ethibond was placed through the greater trochanter and sutured down to the lateral femur and it appeared relatively stable. The wound was then closed with a running heavy quill suture for the deep fascia, 2-0 Vicryl subcutaneously, and skin staples. A Wound VAC was applied over the incision with Mepitel initially applied.   ESTIMATED BLOOD LOSS: 350 mL with 150 reinfused.  IMPLANTS: Medacta Versa fit cup liner 48 mm with a dual mobility liner of the same size, and a medium 28 mm head, a size 7 standard AMIS stem and AMIS stem was also inserted.   DISPOSITION: The patient was sent to the recovery room in stable condition.   SPECIMEN: Removed femoral head.   ____________________________ Leitha SchullerMichael J. Demichael Traum, MD mjm:drc D: 10/27/2012 22:42:29 ET T: 10/28/2012 10:35:42 ET JOB#: [id number]  cc: Leitha SchullerMichael J. Yojan Paskett, MD, <Dictator> Leitha SchullerMICHAEL J Ieesha Abbasi MD ELECTRONICALLY SIGNED 10/28/2012 12:36

## 2020-09-08 DEATH — deceased
